# Patient Record
Sex: Female | Born: 1935 | Race: White | Hispanic: No | Marital: Married | Smoking: Never smoker
Health system: Southern US, Community
[De-identification: ages and names within clinical notes are randomized; demographics above are authoritative.]

## PROBLEM LIST (undated history)

## (undated) DIAGNOSIS — F0390 Unspecified dementia without behavioral disturbance: Secondary | ICD-10-CM

## (undated) DIAGNOSIS — E079 Disorder of thyroid, unspecified: Secondary | ICD-10-CM

## (undated) DIAGNOSIS — I10 Essential (primary) hypertension: Secondary | ICD-10-CM

## (undated) DIAGNOSIS — I4891 Unspecified atrial fibrillation: Secondary | ICD-10-CM

## (undated) HISTORY — DX: Disorder of thyroid, unspecified: E07.9

## (undated) HISTORY — DX: Unspecified atrial fibrillation: I48.91

---
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
## 2000-11-21 DIAGNOSIS — I4891 Unspecified atrial fibrillation: Secondary | ICD-10-CM

## 2000-11-21 HISTORY — DX: Unspecified atrial fibrillation: I48.91

## 2005-11-21 HISTORY — PX: INCONTINENCE SURGERY: SHX676

## 2011-11-22 HISTORY — PX: KYPHOPLASTY: SHX5884

## 2011-12-06 DIAGNOSIS — H4011X0 Primary open-angle glaucoma, stage unspecified: Secondary | ICD-10-CM | POA: Diagnosis not present

## 2011-12-06 DIAGNOSIS — H409 Unspecified glaucoma: Secondary | ICD-10-CM | POA: Diagnosis not present

## 2011-12-08 DIAGNOSIS — Z2889 Immunization not carried out for other reason: Secondary | ICD-10-CM | POA: Diagnosis not present

## 2011-12-08 DIAGNOSIS — R5381 Other malaise: Secondary | ICD-10-CM | POA: Diagnosis not present

## 2011-12-08 DIAGNOSIS — R5383 Other fatigue: Secondary | ICD-10-CM | POA: Diagnosis not present

## 2011-12-08 DIAGNOSIS — Z713 Dietary counseling and surveillance: Secondary | ICD-10-CM | POA: Diagnosis not present

## 2011-12-08 DIAGNOSIS — Z789 Other specified health status: Secondary | ICD-10-CM | POA: Diagnosis not present

## 2011-12-20 DIAGNOSIS — Z1231 Encounter for screening mammogram for malignant neoplasm of breast: Secondary | ICD-10-CM | POA: Diagnosis not present

## 2012-03-15 DIAGNOSIS — I4891 Unspecified atrial fibrillation: Secondary | ICD-10-CM | POA: Diagnosis not present

## 2012-03-15 DIAGNOSIS — I2510 Atherosclerotic heart disease of native coronary artery without angina pectoris: Secondary | ICD-10-CM | POA: Diagnosis not present

## 2012-04-04 DIAGNOSIS — H4011X0 Primary open-angle glaucoma, stage unspecified: Secondary | ICD-10-CM | POA: Diagnosis not present

## 2012-04-04 DIAGNOSIS — H409 Unspecified glaucoma: Secondary | ICD-10-CM | POA: Diagnosis not present

## 2012-04-04 DIAGNOSIS — H353190 Nonexudative age-related macular degeneration, unspecified eye, stage unspecified: Secondary | ICD-10-CM | POA: Diagnosis not present

## 2012-04-09 DIAGNOSIS — M48062 Spinal stenosis, lumbar region with neurogenic claudication: Secondary | ICD-10-CM | POA: Diagnosis not present

## 2012-04-09 DIAGNOSIS — M545 Low back pain: Secondary | ICD-10-CM | POA: Diagnosis not present

## 2012-04-09 DIAGNOSIS — M25569 Pain in unspecified knee: Secondary | ICD-10-CM | POA: Diagnosis not present

## 2012-04-09 DIAGNOSIS — M25559 Pain in unspecified hip: Secondary | ICD-10-CM | POA: Diagnosis not present

## 2012-04-12 DIAGNOSIS — M25559 Pain in unspecified hip: Secondary | ICD-10-CM | POA: Diagnosis not present

## 2012-04-12 DIAGNOSIS — M545 Low back pain: Secondary | ICD-10-CM | POA: Diagnosis not present

## 2012-04-12 DIAGNOSIS — M48061 Spinal stenosis, lumbar region without neurogenic claudication: Secondary | ICD-10-CM | POA: Diagnosis not present

## 2012-05-21 DIAGNOSIS — M48062 Spinal stenosis, lumbar region with neurogenic claudication: Secondary | ICD-10-CM | POA: Diagnosis not present

## 2012-05-21 DIAGNOSIS — M545 Low back pain: Secondary | ICD-10-CM | POA: Diagnosis not present

## 2012-05-21 DIAGNOSIS — M25559 Pain in unspecified hip: Secondary | ICD-10-CM | POA: Diagnosis not present

## 2012-07-02 DIAGNOSIS — M545 Low back pain: Secondary | ICD-10-CM | POA: Diagnosis not present

## 2012-07-02 DIAGNOSIS — M25559 Pain in unspecified hip: Secondary | ICD-10-CM | POA: Diagnosis not present

## 2012-07-02 DIAGNOSIS — M48062 Spinal stenosis, lumbar region with neurogenic claudication: Secondary | ICD-10-CM | POA: Diagnosis not present

## 2012-07-06 DIAGNOSIS — R5383 Other fatigue: Secondary | ICD-10-CM | POA: Diagnosis not present

## 2012-07-06 DIAGNOSIS — Z789 Other specified health status: Secondary | ICD-10-CM | POA: Diagnosis not present

## 2012-07-06 DIAGNOSIS — Z713 Dietary counseling and surveillance: Secondary | ICD-10-CM | POA: Diagnosis not present

## 2012-07-06 DIAGNOSIS — R5381 Other malaise: Secondary | ICD-10-CM | POA: Diagnosis not present

## 2012-07-06 DIAGNOSIS — E039 Hypothyroidism, unspecified: Secondary | ICD-10-CM | POA: Diagnosis not present

## 2012-07-06 DIAGNOSIS — E785 Hyperlipidemia, unspecified: Secondary | ICD-10-CM | POA: Diagnosis not present

## 2012-07-06 DIAGNOSIS — I10 Essential (primary) hypertension: Secondary | ICD-10-CM | POA: Diagnosis not present

## 2012-08-28 DIAGNOSIS — M519 Unspecified thoracic, thoracolumbar and lumbosacral intervertebral disc disorder: Secondary | ICD-10-CM | POA: Diagnosis not present

## 2012-08-28 DIAGNOSIS — M48062 Spinal stenosis, lumbar region with neurogenic claudication: Secondary | ICD-10-CM | POA: Diagnosis not present

## 2012-08-28 DIAGNOSIS — E7800 Pure hypercholesterolemia, unspecified: Secondary | ICD-10-CM | POA: Diagnosis not present

## 2012-08-28 DIAGNOSIS — I10 Essential (primary) hypertension: Secondary | ICD-10-CM | POA: Diagnosis not present

## 2012-08-28 DIAGNOSIS — I4891 Unspecified atrial fibrillation: Secondary | ICD-10-CM | POA: Diagnosis not present

## 2012-08-28 DIAGNOSIS — Z882 Allergy status to sulfonamides status: Secondary | ICD-10-CM | POA: Diagnosis not present

## 2012-08-30 DIAGNOSIS — I10 Essential (primary) hypertension: Secondary | ICD-10-CM | POA: Diagnosis not present

## 2012-08-30 DIAGNOSIS — E7800 Pure hypercholesterolemia, unspecified: Secondary | ICD-10-CM | POA: Diagnosis not present

## 2012-08-30 DIAGNOSIS — M48061 Spinal stenosis, lumbar region without neurogenic claudication: Secondary | ICD-10-CM | POA: Diagnosis not present

## 2012-08-30 DIAGNOSIS — M5380 Other specified dorsopathies, site unspecified: Secondary | ICD-10-CM | POA: Diagnosis not present

## 2012-08-30 DIAGNOSIS — M519 Unspecified thoracic, thoracolumbar and lumbosacral intervertebral disc disorder: Secondary | ICD-10-CM | POA: Diagnosis not present

## 2012-08-30 DIAGNOSIS — M5137 Other intervertebral disc degeneration, lumbosacral region: Secondary | ICD-10-CM | POA: Diagnosis not present

## 2012-08-30 DIAGNOSIS — I4891 Unspecified atrial fibrillation: Secondary | ICD-10-CM | POA: Diagnosis not present

## 2012-08-30 DIAGNOSIS — M48062 Spinal stenosis, lumbar region with neurogenic claudication: Secondary | ICD-10-CM | POA: Diagnosis not present

## 2012-08-30 DIAGNOSIS — Z882 Allergy status to sulfonamides status: Secondary | ICD-10-CM | POA: Diagnosis not present

## 2012-09-11 DIAGNOSIS — M48061 Spinal stenosis, lumbar region without neurogenic claudication: Secondary | ICD-10-CM | POA: Diagnosis not present

## 2012-09-11 DIAGNOSIS — Z09 Encounter for follow-up examination after completed treatment for conditions other than malignant neoplasm: Secondary | ICD-10-CM | POA: Diagnosis not present

## 2012-09-11 DIAGNOSIS — Z713 Dietary counseling and surveillance: Secondary | ICD-10-CM | POA: Diagnosis not present

## 2012-09-11 DIAGNOSIS — Z789 Other specified health status: Secondary | ICD-10-CM | POA: Diagnosis not present

## 2012-09-11 DIAGNOSIS — I499 Cardiac arrhythmia, unspecified: Secondary | ICD-10-CM | POA: Diagnosis not present

## 2012-09-14 DIAGNOSIS — M545 Low back pain: Secondary | ICD-10-CM | POA: Diagnosis not present

## 2012-09-14 DIAGNOSIS — M48062 Spinal stenosis, lumbar region with neurogenic claudication: Secondary | ICD-10-CM | POA: Diagnosis not present

## 2012-09-14 DIAGNOSIS — M5137 Other intervertebral disc degeneration, lumbosacral region: Secondary | ICD-10-CM | POA: Diagnosis not present

## 2012-09-14 DIAGNOSIS — M25559 Pain in unspecified hip: Secondary | ICD-10-CM | POA: Diagnosis not present

## 2012-09-19 DIAGNOSIS — I10 Essential (primary) hypertension: Secondary | ICD-10-CM | POA: Diagnosis not present

## 2012-09-19 DIAGNOSIS — E785 Hyperlipidemia, unspecified: Secondary | ICD-10-CM | POA: Diagnosis not present

## 2012-09-19 DIAGNOSIS — I4891 Unspecified atrial fibrillation: Secondary | ICD-10-CM | POA: Diagnosis not present

## 2012-09-19 DIAGNOSIS — K5730 Diverticulosis of large intestine without perforation or abscess without bleeding: Secondary | ICD-10-CM | POA: Diagnosis not present

## 2012-10-01 DIAGNOSIS — R079 Chest pain, unspecified: Secondary | ICD-10-CM | POA: Diagnosis not present

## 2012-10-01 DIAGNOSIS — I4891 Unspecified atrial fibrillation: Secondary | ICD-10-CM | POA: Diagnosis not present

## 2012-10-04 DIAGNOSIS — I4891 Unspecified atrial fibrillation: Secondary | ICD-10-CM | POA: Diagnosis not present

## 2012-10-04 DIAGNOSIS — E785 Hyperlipidemia, unspecified: Secondary | ICD-10-CM | POA: Diagnosis not present

## 2012-11-23 DIAGNOSIS — Z23 Encounter for immunization: Secondary | ICD-10-CM | POA: Diagnosis not present

## 2012-12-13 DIAGNOSIS — H353190 Nonexudative age-related macular degeneration, unspecified eye, stage unspecified: Secondary | ICD-10-CM | POA: Diagnosis not present

## 2013-01-15 DIAGNOSIS — E039 Hypothyroidism, unspecified: Secondary | ICD-10-CM | POA: Diagnosis not present

## 2013-01-15 DIAGNOSIS — I499 Cardiac arrhythmia, unspecified: Secondary | ICD-10-CM | POA: Diagnosis not present

## 2013-01-15 DIAGNOSIS — Z713 Dietary counseling and surveillance: Secondary | ICD-10-CM | POA: Diagnosis not present

## 2013-01-15 DIAGNOSIS — Z87891 Personal history of nicotine dependence: Secondary | ICD-10-CM | POA: Diagnosis not present

## 2013-01-15 DIAGNOSIS — E785 Hyperlipidemia, unspecified: Secondary | ICD-10-CM | POA: Diagnosis not present

## 2013-01-15 DIAGNOSIS — I10 Essential (primary) hypertension: Secondary | ICD-10-CM | POA: Diagnosis not present

## 2013-01-15 DIAGNOSIS — Z789 Other specified health status: Secondary | ICD-10-CM | POA: Diagnosis not present

## 2013-01-16 DIAGNOSIS — E039 Hypothyroidism, unspecified: Secondary | ICD-10-CM | POA: Diagnosis not present

## 2013-01-16 DIAGNOSIS — E785 Hyperlipidemia, unspecified: Secondary | ICD-10-CM | POA: Diagnosis not present

## 2013-01-16 DIAGNOSIS — I10 Essential (primary) hypertension: Secondary | ICD-10-CM | POA: Diagnosis not present

## 2013-01-18 DIAGNOSIS — H40019 Open angle with borderline findings, low risk, unspecified eye: Secondary | ICD-10-CM | POA: Diagnosis not present

## 2013-01-18 DIAGNOSIS — H26499 Other secondary cataract, unspecified eye: Secondary | ICD-10-CM | POA: Diagnosis not present

## 2013-02-22 DIAGNOSIS — H4011X0 Primary open-angle glaucoma, stage unspecified: Secondary | ICD-10-CM | POA: Diagnosis not present

## 2013-04-08 DIAGNOSIS — M316 Other giant cell arteritis: Secondary | ICD-10-CM | POA: Diagnosis not present

## 2013-04-08 DIAGNOSIS — I776 Arteritis, unspecified: Secondary | ICD-10-CM | POA: Diagnosis not present

## 2013-04-08 DIAGNOSIS — H53129 Transient visual loss, unspecified eye: Secondary | ICD-10-CM | POA: Diagnosis not present

## 2013-04-08 DIAGNOSIS — H3400 Transient retinal artery occlusion, unspecified eye: Secondary | ICD-10-CM | POA: Diagnosis not present

## 2013-04-09 DIAGNOSIS — I6529 Occlusion and stenosis of unspecified carotid artery: Secondary | ICD-10-CM | POA: Diagnosis not present

## 2013-04-09 DIAGNOSIS — H3400 Transient retinal artery occlusion, unspecified eye: Secondary | ICD-10-CM | POA: Diagnosis not present

## 2013-04-22 DIAGNOSIS — E039 Hypothyroidism, unspecified: Secondary | ICD-10-CM | POA: Diagnosis not present

## 2013-04-22 DIAGNOSIS — Z789 Other specified health status: Secondary | ICD-10-CM | POA: Diagnosis not present

## 2013-04-22 DIAGNOSIS — Z87891 Personal history of nicotine dependence: Secondary | ICD-10-CM | POA: Diagnosis not present

## 2013-04-22 DIAGNOSIS — Z713 Dietary counseling and surveillance: Secondary | ICD-10-CM | POA: Diagnosis not present

## 2013-04-22 DIAGNOSIS — I10 Essential (primary) hypertension: Secondary | ICD-10-CM | POA: Diagnosis not present

## 2013-04-24 DIAGNOSIS — Z1231 Encounter for screening mammogram for malignant neoplasm of breast: Secondary | ICD-10-CM | POA: Diagnosis not present

## 2013-04-26 DIAGNOSIS — H4011X0 Primary open-angle glaucoma, stage unspecified: Secondary | ICD-10-CM | POA: Diagnosis not present

## 2013-06-04 DIAGNOSIS — H40019 Open angle with borderline findings, low risk, unspecified eye: Secondary | ICD-10-CM | POA: Diagnosis not present

## 2013-06-28 DIAGNOSIS — H401290 Low-tension glaucoma, unspecified eye, stage unspecified: Secondary | ICD-10-CM | POA: Diagnosis not present

## 2013-07-05 DIAGNOSIS — H401290 Low-tension glaucoma, unspecified eye, stage unspecified: Secondary | ICD-10-CM | POA: Diagnosis not present

## 2013-07-12 DIAGNOSIS — H1010 Acute atopic conjunctivitis, unspecified eye: Secondary | ICD-10-CM | POA: Diagnosis not present

## 2013-09-06 DIAGNOSIS — Z23 Encounter for immunization: Secondary | ICD-10-CM | POA: Diagnosis not present

## 2013-10-22 DIAGNOSIS — E039 Hypothyroidism, unspecified: Secondary | ICD-10-CM | POA: Diagnosis not present

## 2013-10-22 DIAGNOSIS — I10 Essential (primary) hypertension: Secondary | ICD-10-CM | POA: Diagnosis not present

## 2013-10-22 DIAGNOSIS — F329 Major depressive disorder, single episode, unspecified: Secondary | ICD-10-CM | POA: Diagnosis not present

## 2013-10-22 DIAGNOSIS — E785 Hyperlipidemia, unspecified: Secondary | ICD-10-CM | POA: Diagnosis not present

## 2013-10-23 DIAGNOSIS — E039 Hypothyroidism, unspecified: Secondary | ICD-10-CM | POA: Diagnosis not present

## 2013-10-23 DIAGNOSIS — E785 Hyperlipidemia, unspecified: Secondary | ICD-10-CM | POA: Diagnosis not present

## 2013-12-25 DIAGNOSIS — H4011X0 Primary open-angle glaucoma, stage unspecified: Secondary | ICD-10-CM | POA: Diagnosis not present

## 2013-12-25 DIAGNOSIS — H409 Unspecified glaucoma: Secondary | ICD-10-CM | POA: Diagnosis not present

## 2014-01-04 DIAGNOSIS — H04129 Dry eye syndrome of unspecified lacrimal gland: Secondary | ICD-10-CM | POA: Diagnosis not present

## 2014-03-25 DIAGNOSIS — H353190 Nonexudative age-related macular degeneration, unspecified eye, stage unspecified: Secondary | ICD-10-CM | POA: Diagnosis not present

## 2014-05-14 ENCOUNTER — Ambulatory Visit (INDEPENDENT_AMBULATORY_CARE_PROVIDER_SITE_OTHER): Payer: Medicare Other | Admitting: Family Medicine

## 2014-05-14 ENCOUNTER — Encounter: Payer: Self-pay | Admitting: Family Medicine

## 2014-05-14 VITALS — BP 141/90 | HR 84 | Temp 98.2°F | Wt 156.0 lb

## 2014-05-14 DIAGNOSIS — N3281 Overactive bladder: Secondary | ICD-10-CM | POA: Insufficient documentation

## 2014-05-14 DIAGNOSIS — Z79899 Other long term (current) drug therapy: Secondary | ICD-10-CM

## 2014-05-14 DIAGNOSIS — E039 Hypothyroidism, unspecified: Secondary | ICD-10-CM | POA: Diagnosis not present

## 2014-05-14 DIAGNOSIS — N318 Other neuromuscular dysfunction of bladder: Secondary | ICD-10-CM | POA: Diagnosis not present

## 2014-05-14 DIAGNOSIS — I4891 Unspecified atrial fibrillation: Secondary | ICD-10-CM | POA: Diagnosis not present

## 2014-05-14 DIAGNOSIS — I10 Essential (primary) hypertension: Secondary | ICD-10-CM | POA: Diagnosis not present

## 2014-05-14 DIAGNOSIS — E785 Hyperlipidemia, unspecified: Secondary | ICD-10-CM | POA: Diagnosis not present

## 2014-05-14 DIAGNOSIS — I482 Chronic atrial fibrillation, unspecified: Secondary | ICD-10-CM | POA: Insufficient documentation

## 2014-05-14 DIAGNOSIS — Z5181 Encounter for therapeutic drug level monitoring: Secondary | ICD-10-CM | POA: Insufficient documentation

## 2014-05-14 DIAGNOSIS — M25559 Pain in unspecified hip: Secondary | ICD-10-CM

## 2014-05-14 DIAGNOSIS — M25551 Pain in right hip: Secondary | ICD-10-CM | POA: Insufficient documentation

## 2014-05-14 MED ORDER — MIRABEGRON ER 25 MG PO TB24: 25.0000 mg | ORAL_TABLET | Freq: Every day | ORAL | Status: DC

## 2014-05-14 NOTE — Patient Instructions (Signed)
Call me if bladder symptoms improve over the next four weeks after starting the daily Myrbetriq

## 2014-05-14 NOTE — Progress Notes (Signed)
CC: Wanda Myers is a 80 y.o. female is here for Establish Care   Subjective: HPI:  Very pleasant 80 year old here to establish care accompanied by her daughter Wanda Myers  Reports a history of hypothyroidism that was acquired due to radiation exposure. She is currently taking levothyroxine 50 mcg on a daily basis without missed doses. She believes it has been well over a year since TSH was checked last. Denies unintentional weight loss, gain. Denies constipation or diarrhea  She reports a history of chronic atrial fibrillation currently on a beta blocker, metoprolol, and has elected to avoid anticoagulation due to fall risk. She denies peripheral edema, orthopnea nor rapid heartbeat.  Reports a history of hyperlipidemia: Taking pravastatin on a daily basis without myalgias nor right upper quadrant pain. She is uncertain when her last fasting lipid panel was obtained. She's not fasting today.  Complains of bladder leakage that has been occurring on a daily basis for matter of months to years. Originally was improved with a bladder sling but now her symptoms are back and are moderate to severe in severity. Can occur anytime of the day she is wearing past 24 hours of the day. Present only in the sense of urgency, denies any leakage with cough/sneeze/Valsalva maneuver.  Denies urinary frequency, nor dysuria. She does not believe she's ever been on medications for this.  Reports a history of hypertension: Currently on metoprolol. No blood pressures to report.    Complains of right hip pain that has been present for matter of years. Described as only pain, moderate in severity, almost absent with weightbearing or while walking and for the most part only noticed when lying on the right side. It will wake her from sleep if she rolls over on her right hip. She denies any recent or remote trauma or overexertion. Pain is nonradiating. She denies any groin pain. She believes that a physician once placed 6  "objects"in and around the hip but this did not improve her pain whatsoever. No other interventions as of yet.  Review of Systems - General ROS: negative for - chills, fever, night sweats, weight gain or weight loss Ophthalmic ROS: negative for - decreased vision Psychological ROS: negative for - anxiety or depression ENT ROS: negative for - hearing change, nasal congestion, tinnitus or allergies Hematological and Lymphatic ROS: negative for - bleeding problems, bruising or swollen lymph nodes Breast ROS: negative Respiratory ROS: no cough, shortness of breath, or wheezing Cardiovascular ROS: no chest pain or dyspnea on exertion Gastrointestinal ROS: no abdominal pain, change in bowel habits, or black or bloody stools Genito-Urinary ROS: negative for - genital discharge, genital ulcers,  or abnormal bleeding from genitals Musculoskeletal ROS: negative for - joint pain or muscle pain other than that described above Neurological ROS: negative for - headaches or memory loss Dermatological ROS: negative for lumps, mole changes, rash and skin lesion changes  Past Medical History  Diagnosis Date  . A-fib 2002  . Thyroid disease     Past Surgical History  Procedure Laterality Date  . Incontinence surgery  2007  . Kyphoplasty  2013  . Appendectomy  1944   Family History  Problem Relation Age of Onset  . Heart attack Father   . Hyperlipidemia Father   . Hypertension Father     History   Social History  . Marital Status: Married    Spouse Name: N/A    Number of Children: N/A  . Years of Education: N/A   Occupational History  . Not  on file.   Social History Main Topics  . Smoking status: Never Smoker   . Smokeless tobacco: Not on file  . Alcohol Use: No  . Drug Use: Not on file  . Sexual Activity: Not on file   Other Topics Concern  . Not on file   Social History Narrative  . No narrative on file     Objective: BP 156/100  Pulse 84  Temp(Src) 98.2 F (36.8 C) (Oral)   Wt 156 lb (70.761 kg)  General: Alert and Oriented, No Acute Distress HEENT: Pupils equal, round, reactive to light. Conjunctivae clear.  Moist mucous membranes pharynx unremarkable Lungs: Clear to auscultation bilaterally, no wheezing/ronchi/rales.  Comfortable work of breathing. Good air movement. Cardiac: Irregular rate and rhythm. Normal S1/S2.  No murmurs, rubs, nor gallops.   Abdomen: Soft nontender Extremities: No peripheral edema.  Strong peripheral pulses. She has full range of motion and strength in the right lower extremity. Exam of the right lower extreme shows negative log roll, negative femoral grind, negative straight leg raise, negative FABER, pain is somewhat reproduced with palpation of the right greater trochanter and just slightly superior to this.  Mental Status: No depression, anxiety, nor agitation. Skin: Warm and dry.  Assessment & Plan: Wanda CalicoFrances was seen today for establish care.  Diagnoses and associated orders for this visit:  Hypothyroidism (acquired) - TSH  Chronic atrial fibrillation  Hyperlipidemia  Overactive bladder  Essential hypertension, benign - COMPLETE METABOLIC PANEL WITH GFR  Encounter for monitoring statin therapy - COMPLETE METABOLIC PANEL WITH GFR  Right hip pain  Other Orders - mirabegron ER (MYRBETRIQ) 25 MG TB24 tablet; Take 1 tablet (25 mg total) by mouth daily.    Hypothyroidism: Overdue for TSH continue levothyroxine pending results Chronic atrial fibrillation: Rate controlled, agree with the decision of no anticoagulation beyond aspirin given her fall risk for Hyperlipidemia: Continue pravastatin Will obtain lipid panel at her convenience when she can fast, checking liver enzymes today Essential hypertension: Uncontrolled, I'd like to see some home blood pressure readings before adjusting metoprolol Overactive bladder: Start Myrbetriq, 28 days of samples were provided, I've asked family to call me if they're providing any  benefit for a formal prescription Right hip pain: I've encouraged him to followup with Dr. Karie Schwalbe. in sports medicine for further evaluation of my suspicion of right greater trochanteric bursitis. I do not think that films are needed at this time.Referral will be placed.  Return in about 3 months (around 08/14/2014) for Routine Follow Up.

## 2014-05-15 ENCOUNTER — Telehealth: Payer: Self-pay | Admitting: Family Medicine

## 2014-05-15 ENCOUNTER — Encounter: Payer: Self-pay | Admitting: Family Medicine

## 2014-05-15 DIAGNOSIS — N183 Chronic kidney disease, stage 3 unspecified: Secondary | ICD-10-CM | POA: Insufficient documentation

## 2014-05-15 LAB — COMPLETE METABOLIC PANEL WITH GFR
ALBUMIN: 4.3 g/dL (ref 3.5–5.2)
ALT: 17 U/L (ref 0–35)
AST: 27 U/L (ref 0–37)
Alkaline Phosphatase: 50 U/L (ref 39–117)
BUN: 20 mg/dL (ref 6–23)
CALCIUM: 10.2 mg/dL (ref 8.4–10.5)
CHLORIDE: 103 meq/L (ref 96–112)
CO2: 33 mEq/L — ABNORMAL HIGH (ref 19–32)
Creat: 1.12 mg/dL — ABNORMAL HIGH (ref 0.50–1.10)
GFR, Est African American: 50 mL/min — ABNORMAL LOW
GFR, Est Non African American: 44 mL/min — ABNORMAL LOW
Glucose, Bld: 105 mg/dL — ABNORMAL HIGH (ref 70–99)
POTASSIUM: 3.9 meq/L (ref 3.5–5.3)
Sodium: 141 mEq/L (ref 135–145)
Total Bilirubin: 0.8 mg/dL (ref 0.2–1.2)
Total Protein: 6.9 g/dL (ref 6.0–8.3)

## 2014-05-15 LAB — TSH: TSH: 1.363 u[IU]/mL (ref 0.350–4.500)

## 2014-05-15 NOTE — Telephone Encounter (Signed)
Wanda Myers, Will you please let the patient's family know that a [facility] representative should be contacting them in the next few days regarding a home visit.  For both Wanda Myers and her husband.

## 2014-05-16 NOTE — Telephone Encounter (Signed)
Pt.notified

## 2014-05-19 ENCOUNTER — Ambulatory Visit (INDEPENDENT_AMBULATORY_CARE_PROVIDER_SITE_OTHER): Payer: Medicare Other | Admitting: Sports Medicine

## 2014-05-19 ENCOUNTER — Encounter: Payer: Self-pay | Admitting: Sports Medicine

## 2014-05-19 VITALS — BP 160/113 | HR 85 | Ht 67.0 in

## 2014-05-19 DIAGNOSIS — M76899 Other specified enthesopathies of unspecified lower limb, excluding foot: Secondary | ICD-10-CM | POA: Diagnosis not present

## 2014-05-19 DIAGNOSIS — M7061 Trochanteric bursitis, right hip: Secondary | ICD-10-CM | POA: Insufficient documentation

## 2014-05-19 MED ORDER — METOPROLOL TARTRATE 25 MG PO TABS: 25.0000 mg | ORAL_TABLET | Freq: Two times a day (BID) | ORAL | Status: DC

## 2014-05-19 NOTE — Assessment & Plan Note (Signed)
Trochanteric bursa injection as above. Home health physical therapy. Return in one month.

## 2014-05-19 NOTE — Progress Notes (Signed)
   Subjective:    I'm seeing this patient as a consultation for:  Dr. Ivan AnchorsHommel  CC: Right hip pain  HPI: This is a very pleasant 80 year old female, for several years she's had pain which he localizes over the right greater trochanter, she has difficulty laying on the ipsilateral side. Pain is moderate, persistent without radiation. She has tried over-the-counter NSAIDs without any improvement, she has never had a trochanteric bursa injection, and has never had formal physical therapy. She is essentially wheelchair-bound and has difficulty leaving her home.  Past medical history, Surgical history, Family history not pertinant except as noted below, Social history, Allergies, and medications have been entered into the medical record, reviewed, and no changes needed.   Review of Systems: No headache, visual changes, nausea, vomiting, diarrhea, constipation, dizziness, abdominal pain, skin rash, fevers, chills, night sweats, weight loss, swollen lymph nodes, body aches, joint swelling, muscle aches, chest pain, shortness of breath, mood changes, visual or auditory hallucinations.   Objective:   General: Well Developed, well nourished, and in no acute distress.  Neuro/Psych: Alert and oriented x3, extra-ocular muscles intact, able to move all 4 extremities, sensation grossly intact. Skin: Warm and dry, no rashes noted.  Respiratory: Not using accessory muscles, speaking in full sentences, trachea midline.  Cardiovascular: Pulses palpable, no extremity edema. Abdomen: Does not appear distended. Right hip: Tender to palpation over greater trochanter, weak abductor's, no pain with internal rotation, excellent rotation.  Procedure:  Injection of right greater trochanter bursa Consent obtained and verified. Time-out conducted. Noted no overlying erythema, induration, or other signs of local infection. Skin prepped in a sterile fashion. Topical analgesic spray: Ethyl chloride. Completed without  difficulty. Meds: Using a spinal needle, 1 cc Kenalog 40, 4 cc lidocaine injected in a fanlike pattern into and around the greater trochanteric bursa. Pain immediately improved suggesting accurate placement of the medication. Advised to call if fevers/chills, erythema, induration, drainage, or persistent bleeding.  Impression and Recommendations:   This case required medical decision making of moderate complexity.

## 2014-05-26 ENCOUNTER — Encounter: Payer: Self-pay | Admitting: Family Medicine

## 2014-05-28 ENCOUNTER — Encounter: Payer: Self-pay | Admitting: Sports Medicine

## 2014-05-28 ENCOUNTER — Other Ambulatory Visit: Payer: Self-pay | Admitting: Family Medicine

## 2014-05-28 DIAGNOSIS — M7062 Trochanteric bursitis, left hip: Principal | ICD-10-CM

## 2014-05-28 DIAGNOSIS — M7061 Trochanteric bursitis, right hip: Secondary | ICD-10-CM

## 2014-05-30 MED ORDER — MIRABEGRON ER 25 MG PO TB24: 25.0000 mg | ORAL_TABLET | Freq: Every day | ORAL | Status: DC

## 2014-05-30 MED ORDER — PRAVASTATIN SODIUM 40 MG PO TABS: 40.0000 mg | ORAL_TABLET | Freq: Every day | ORAL | Status: DC

## 2014-05-30 MED ORDER — LEVOTHYROXINE SODIUM 50 MCG PO TABS: 50.0000 ug | ORAL_TABLET | Freq: Every day | ORAL | Status: DC

## 2014-06-03 DIAGNOSIS — Z993 Dependence on wheelchair: Secondary | ICD-10-CM | POA: Diagnosis not present

## 2014-06-03 DIAGNOSIS — IMO0001 Reserved for inherently not codable concepts without codable children: Secondary | ICD-10-CM | POA: Diagnosis not present

## 2014-06-03 DIAGNOSIS — I10 Essential (primary) hypertension: Secondary | ICD-10-CM | POA: Diagnosis not present

## 2014-06-03 DIAGNOSIS — M76899 Other specified enthesopathies of unspecified lower limb, excluding foot: Secondary | ICD-10-CM | POA: Diagnosis not present

## 2014-06-06 DIAGNOSIS — M76899 Other specified enthesopathies of unspecified lower limb, excluding foot: Secondary | ICD-10-CM | POA: Diagnosis not present

## 2014-06-06 DIAGNOSIS — IMO0001 Reserved for inherently not codable concepts without codable children: Secondary | ICD-10-CM | POA: Diagnosis not present

## 2014-06-06 DIAGNOSIS — I10 Essential (primary) hypertension: Secondary | ICD-10-CM | POA: Diagnosis not present

## 2014-06-06 DIAGNOSIS — Z993 Dependence on wheelchair: Secondary | ICD-10-CM | POA: Diagnosis not present

## 2014-06-09 DIAGNOSIS — I10 Essential (primary) hypertension: Secondary | ICD-10-CM | POA: Diagnosis not present

## 2014-06-09 DIAGNOSIS — IMO0001 Reserved for inherently not codable concepts without codable children: Secondary | ICD-10-CM | POA: Diagnosis not present

## 2014-06-09 DIAGNOSIS — Z993 Dependence on wheelchair: Secondary | ICD-10-CM | POA: Diagnosis not present

## 2014-06-09 DIAGNOSIS — M76899 Other specified enthesopathies of unspecified lower limb, excluding foot: Secondary | ICD-10-CM | POA: Diagnosis not present

## 2014-06-11 DIAGNOSIS — I10 Essential (primary) hypertension: Secondary | ICD-10-CM | POA: Diagnosis not present

## 2014-06-11 DIAGNOSIS — Z993 Dependence on wheelchair: Secondary | ICD-10-CM | POA: Diagnosis not present

## 2014-06-11 DIAGNOSIS — IMO0001 Reserved for inherently not codable concepts without codable children: Secondary | ICD-10-CM | POA: Diagnosis not present

## 2014-06-11 DIAGNOSIS — M76899 Other specified enthesopathies of unspecified lower limb, excluding foot: Secondary | ICD-10-CM | POA: Diagnosis not present

## 2014-06-16 DIAGNOSIS — I10 Essential (primary) hypertension: Secondary | ICD-10-CM | POA: Diagnosis not present

## 2014-06-16 DIAGNOSIS — IMO0001 Reserved for inherently not codable concepts without codable children: Secondary | ICD-10-CM | POA: Diagnosis not present

## 2014-06-16 DIAGNOSIS — Z993 Dependence on wheelchair: Secondary | ICD-10-CM | POA: Diagnosis not present

## 2014-06-16 DIAGNOSIS — M76899 Other specified enthesopathies of unspecified lower limb, excluding foot: Secondary | ICD-10-CM | POA: Diagnosis not present

## 2014-06-20 DIAGNOSIS — M76899 Other specified enthesopathies of unspecified lower limb, excluding foot: Secondary | ICD-10-CM | POA: Diagnosis not present

## 2014-06-20 DIAGNOSIS — IMO0001 Reserved for inherently not codable concepts without codable children: Secondary | ICD-10-CM | POA: Diagnosis not present

## 2014-06-20 DIAGNOSIS — Z993 Dependence on wheelchair: Secondary | ICD-10-CM | POA: Diagnosis not present

## 2014-06-20 DIAGNOSIS — I10 Essential (primary) hypertension: Secondary | ICD-10-CM | POA: Diagnosis not present

## 2014-06-23 ENCOUNTER — Encounter: Payer: Self-pay | Admitting: Family Medicine

## 2014-06-23 ENCOUNTER — Ambulatory Visit (INDEPENDENT_AMBULATORY_CARE_PROVIDER_SITE_OTHER): Payer: Medicare Other | Admitting: Family Medicine

## 2014-06-23 VITALS — BP 155/97 | HR 87 | Ht 65.0 in

## 2014-06-23 DIAGNOSIS — R5381 Other malaise: Secondary | ICD-10-CM | POA: Diagnosis not present

## 2014-06-23 DIAGNOSIS — R5383 Other fatigue: Secondary | ICD-10-CM

## 2014-06-23 DIAGNOSIS — R300 Dysuria: Secondary | ICD-10-CM | POA: Diagnosis not present

## 2014-06-23 DIAGNOSIS — I482 Chronic atrial fibrillation, unspecified: Secondary | ICD-10-CM

## 2014-06-23 DIAGNOSIS — I4891 Unspecified atrial fibrillation: Secondary | ICD-10-CM

## 2014-06-23 LAB — POCT URINALYSIS DIPSTICK
BILIRUBIN UA: NEGATIVE
GLUCOSE UA: NEGATIVE
KETONES UA: NEGATIVE
Nitrite, UA: NEGATIVE
Protein, UA: 30
SPEC GRAV UA: 1.02
Urobilinogen, UA: 0.2
pH, UA: 7.5

## 2014-06-23 NOTE — Progress Notes (Signed)
CC: Wanda Myers is a 80 y.o. female is here for Extremity Weakness   Subjective: HPI:  Accompanied by daughter, Wanda Myers   Patient reports decreased energy over the past week but has not be getting better or worse it is present all hours of the day on a daily basis. Is described as moderate to severe in severity to the point where she often doesn't want to get out of of her night gown. She has been spending the majority of the time over the past week in her bed. She reports some focal weakness in both lower extremities localized to the knees but denies weakness elsewhere, states she just feels "worn out"for no particular reason. Accompanied by decreased appetite but still taking fluids. Denies cough, shortness of breath, fevers, chills, confusion, mental disturbance, changing medications, abdominal pain, diarrhea, blood in stool, flank pain nor nausea. Review of systems is positive for mild dysuria and continued bladder incontinence. Denies rashes. Denies fevers, chills or night sweats   Review Of Systems Outlined In HPI  Past Medical History  Diagnosis Date  . A-fib 2002  . Thyroid disease     Past Surgical History  Procedure Laterality Date  . Incontinence surgery  2007  . Kyphoplasty  2013  . Appendectomy  1944   Family History  Problem Relation Age of Onset  . Heart attack Father   . Hyperlipidemia Father   . Hypertension Father     History   Social History  . Marital Status: Married    Spouse Name: N/A    Number of Children: N/A  . Years of Education: N/A   Occupational History  . Not on file.   Social History Main Topics  . Smoking status: Never Smoker   . Smokeless tobacco: Not on file  . Alcohol Use: No  . Drug Use: Not on file  . Sexual Activity: Not on file   Other Topics Concern  . Not on file   Social History Narrative  . No narrative on file     Objective: BP 155/97  Pulse 87  Ht 5\' 5"  (1.651 m)  General: Alert and Oriented, No Acute  Distress HEENT: Pupils equal, round, reactive to light. Conjunctivae clear.  External ears unremarkable, canals clear with intact TMs with appropriate landmarks.  Middle ear appears open without effusion. Pink inferior turbinates.  Moist mucous membranes, pharynx without inflammation nor lesions.  Neck supple without palpable lymphadenopathy nor abnormal masses. Lungs: Clear to auscultation bilaterally, no wheezing/ronchi/rales.  Comfortable work of breathing. Good air movement. Cardiac:  irregularly irregular rhythm with mild tachycardia. Normal S1/S2.  No murmurs, rubs, nor gallops.   Abdomen: Normal bowel sounds, soft and non tender without palpable masses. Extremities: No peripheral edema.  Strong peripheral pulses. 4/5 strength in knee extension otherwise full range of motion and strength in both lower extremities  Mental Status: No depression, anxiety, nor agitation. Skin: Warm and dry.  Assessment & Plan: Wanda Myers was seen today for extremity weakness.  Diagnoses and associated orders for this visit:  Other fatigue - COMPLETE METABOLIC PANEL WITH GFR - CBC w/Diff  Chronic atrial fibrillation - Ambulatory referral to Cardiology  Dysuria - Urine culture     fatigue: Differential remains broad, urinalysis shows trace intact blood, if lab work above is unremarkable will begin antibiotic for presumed UTI and I will follow her urine culture. They were given a urine hat and sample bottle to collect more urine at home as she did not provide a urine today for  culture. Additionally anticipate increasing metoprolol pending above labs given mild tachycardia today.family is requesting nonemergent referral to cardiology    Return if symptoms worsen or fail to improve.

## 2014-06-24 ENCOUNTER — Telehealth: Payer: Self-pay | Admitting: Family Medicine

## 2014-06-24 DIAGNOSIS — N390 Urinary tract infection, site not specified: Secondary | ICD-10-CM

## 2014-06-24 DIAGNOSIS — R300 Dysuria: Secondary | ICD-10-CM | POA: Diagnosis not present

## 2014-06-24 LAB — COMPLETE METABOLIC PANEL WITH GFR
ALBUMIN: 3.3 g/dL — AB (ref 3.5–5.2)
ALT: 16 U/L (ref 0–35)
AST: 21 U/L (ref 0–37)
Alkaline Phosphatase: 51 U/L (ref 39–117)
BUN: 14 mg/dL (ref 6–23)
CO2: 27 meq/L (ref 19–32)
Calcium: 9.2 mg/dL (ref 8.4–10.5)
Chloride: 98 mEq/L (ref 96–112)
Creat: 0.94 mg/dL (ref 0.50–1.10)
GFR, EST AFRICAN AMERICAN: 62 mL/min
GFR, EST NON AFRICAN AMERICAN: 54 mL/min — AB
GLUCOSE: 132 mg/dL — AB (ref 70–99)
POTASSIUM: 4 meq/L (ref 3.5–5.3)
Sodium: 134 mEq/L — ABNORMAL LOW (ref 135–145)
Total Bilirubin: 0.7 mg/dL (ref 0.2–1.2)
Total Protein: 5.9 g/dL — ABNORMAL LOW (ref 6.0–8.3)

## 2014-06-24 LAB — CBC WITH DIFFERENTIAL/PLATELET
Basophils Absolute: 0 10*3/uL (ref 0.0–0.1)
Basophils Relative: 0 % (ref 0–1)
EOS ABS: 0.2 10*3/uL (ref 0.0–0.7)
Eosinophils Relative: 4 % (ref 0–5)
HEMATOCRIT: 40.1 % (ref 36.0–46.0)
HEMOGLOBIN: 13.5 g/dL (ref 12.0–15.0)
Lymphocytes Relative: 20 % (ref 12–46)
Lymphs Abs: 1.2 10*3/uL (ref 0.7–4.0)
MCH: 29.5 pg (ref 26.0–34.0)
MCHC: 33.7 g/dL (ref 30.0–36.0)
MCV: 87.6 fL (ref 78.0–100.0)
MONO ABS: 0.3 10*3/uL (ref 0.1–1.0)
MONOS PCT: 5 % (ref 3–12)
NEUTROS ABS: 4.1 10*3/uL (ref 1.7–7.7)
Neutrophils Relative %: 71 % (ref 43–77)
Platelets: 241 10*3/uL (ref 150–400)
RBC: 4.58 MIL/uL (ref 3.87–5.11)
RDW: 14 % (ref 11.5–15.5)
WBC: 5.8 10*3/uL (ref 4.0–10.5)

## 2014-06-24 MED ORDER — CIPROFLOXACIN HCL 250 MG PO TABS: ORAL_TABLET | ORAL | Status: AC

## 2014-06-24 NOTE — Telephone Encounter (Signed)
Tried to call and;rang once and then phone cut off

## 2014-06-24 NOTE — Telephone Encounter (Signed)
Sue Lushndrea, Will you please let patient or daughter know that he only abnormality on her blood work to account for her symptoms would be blood and white blood cells in her urine suspicious for a UTI.  I'll still follow the culture, but i'd recommend she start on ciprofloxacin that I've sent to her [facility].  I'll make sure they're updated once the culture returns.

## 2014-06-25 DIAGNOSIS — IMO0001 Reserved for inherently not codable concepts without codable children: Secondary | ICD-10-CM | POA: Diagnosis not present

## 2014-06-25 DIAGNOSIS — I10 Essential (primary) hypertension: Secondary | ICD-10-CM | POA: Diagnosis not present

## 2014-06-25 DIAGNOSIS — Z993 Dependence on wheelchair: Secondary | ICD-10-CM | POA: Diagnosis not present

## 2014-06-25 DIAGNOSIS — M76899 Other specified enthesopathies of unspecified lower limb, excluding foot: Secondary | ICD-10-CM | POA: Diagnosis not present

## 2014-06-26 LAB — URINE CULTURE: Colony Count: 15000

## 2014-06-26 NOTE — Telephone Encounter (Signed)
Called and husband answered the phone and said to call him back since he was leaving dialysis

## 2014-06-27 DIAGNOSIS — IMO0001 Reserved for inherently not codable concepts without codable children: Secondary | ICD-10-CM | POA: Diagnosis not present

## 2014-06-27 DIAGNOSIS — I10 Essential (primary) hypertension: Secondary | ICD-10-CM | POA: Diagnosis not present

## 2014-06-27 DIAGNOSIS — M76899 Other specified enthesopathies of unspecified lower limb, excluding foot: Secondary | ICD-10-CM | POA: Diagnosis not present

## 2014-06-27 DIAGNOSIS — Z993 Dependence on wheelchair: Secondary | ICD-10-CM | POA: Diagnosis not present

## 2014-06-27 NOTE — Telephone Encounter (Signed)
Pt's husband notified.

## 2014-07-02 DIAGNOSIS — I10 Essential (primary) hypertension: Secondary | ICD-10-CM | POA: Diagnosis not present

## 2014-07-02 DIAGNOSIS — M76899 Other specified enthesopathies of unspecified lower limb, excluding foot: Secondary | ICD-10-CM | POA: Diagnosis not present

## 2014-07-02 DIAGNOSIS — Z993 Dependence on wheelchair: Secondary | ICD-10-CM | POA: Diagnosis not present

## 2014-07-02 DIAGNOSIS — IMO0001 Reserved for inherently not codable concepts without codable children: Secondary | ICD-10-CM | POA: Diagnosis not present

## 2014-07-03 ENCOUNTER — Telehealth: Payer: Self-pay

## 2014-07-03 NOTE — Telephone Encounter (Signed)
Excellent, thank you!

## 2014-07-03 NOTE — Telephone Encounter (Signed)
Ashanti from Home health called and requested a verbal order to continue physical therapy 2 times a week for 3 more weeks.I went ahead and gave her the verbal order, just wanted to make you aware.Rhonda Cunningham,CMA

## 2014-07-07 ENCOUNTER — Telehealth: Payer: Self-pay | Admitting: *Deleted

## 2014-07-07 ENCOUNTER — Telehealth: Payer: Self-pay | Admitting: Family Medicine

## 2014-07-07 DIAGNOSIS — R300 Dysuria: Secondary | ICD-10-CM

## 2014-07-07 NOTE — Telephone Encounter (Signed)
Pt's daughter called and left a message that pt is not feeling well and she has been vomiting every day for about the past two days. Per daughter pt has not ran a fever to her knowlede but she has decreased appetite. Advised her to take her to UC today and if couldn't get to UC ok per Hommel to get on schedule tomorrow. Clydie BraunKaren the daughter voiced underdstanding

## 2014-07-07 NOTE — Telephone Encounter (Signed)
Pt reports continued dysuria, requesting UA but not to be seen.

## 2014-07-08 ENCOUNTER — Encounter: Payer: Self-pay | Admitting: Family Medicine

## 2014-07-08 ENCOUNTER — Ambulatory Visit (INDEPENDENT_AMBULATORY_CARE_PROVIDER_SITE_OTHER): Payer: Medicare Other | Admitting: Family Medicine

## 2014-07-08 VITALS — BP 127/79 | HR 96 | Temp 97.5°F | Wt 143.0 lb

## 2014-07-08 DIAGNOSIS — R300 Dysuria: Secondary | ICD-10-CM

## 2014-07-08 DIAGNOSIS — E785 Hyperlipidemia, unspecified: Secondary | ICD-10-CM

## 2014-07-08 DIAGNOSIS — K5900 Constipation, unspecified: Secondary | ICD-10-CM

## 2014-07-08 DIAGNOSIS — E039 Hypothyroidism, unspecified: Secondary | ICD-10-CM

## 2014-07-08 DIAGNOSIS — F411 Generalized anxiety disorder: Secondary | ICD-10-CM | POA: Diagnosis not present

## 2014-07-08 LAB — POCT URINALYSIS DIPSTICK
Bilirubin, UA: NEGATIVE
Blood, UA: NEGATIVE
Glucose, UA: NEGATIVE
Ketones, UA: NEGATIVE
NITRITE UA: NEGATIVE
PH UA: 7
PROTEIN UA: NEGATIVE
Spec Grav, UA: 1.015
UROBILINOGEN UA: 0.2

## 2014-07-08 MED ORDER — LEVOTHYROXINE SODIUM 50 MCG PO TABS: 50.0000 ug | ORAL_TABLET | Freq: Every day | ORAL | Status: DC

## 2014-07-08 MED ORDER — TIMOLOL HEMIHYDRATE 0.5 % OP SOLN: 1.0000 [drp] | Freq: Two times a day (BID) | OPHTHALMIC | Status: DC

## 2014-07-08 MED ORDER — PRAVASTATIN SODIUM 40 MG PO TABS: 40.0000 mg | ORAL_TABLET | Freq: Every day | ORAL | Status: DC

## 2014-07-08 MED ORDER — ALPRAZOLAM 0.25 MG PO TABS: 0.2500 mg | ORAL_TABLET | Freq: Two times a day (BID) | ORAL | Status: DC | PRN

## 2014-07-08 MED ORDER — CEPHALEXIN 500 MG PO CAPS: 500.0000 mg | ORAL_CAPSULE | Freq: Four times a day (QID) | ORAL | Status: DC

## 2014-07-08 NOTE — Progress Notes (Signed)
CC: Wanda Myers is a 80 y.o. female is here for Urinary Tract Infection   Subjective: HPI:  Accompanied with daughter with concerns of a urinary tract infection has not completely cleared. Patient states she has difficulty describing exactly how she feels but just a sense that she still has a urinary tract infection, there's questionable dysuria and continued fatigue. It sounds like fatigue has improved slightly though since I saw her last after she started on 5 days of Cipro. She reports urinary urgency and continued chronic urinary incontinence. Denies fevers, chills, abdominal pain, flank pain, confusion, night sweats, nor gross blood in urine. Review of systems is positive for vomiting yesterday and the day before that was nonbloody and without coffee ground appearance, she states that her appetite is return if she's not had any nauseousness since going to bed last night  Is requesting refills on levothyroxine without any known intolerance, review of systems is positive for constipation despite use of daily Docusate, she's gone 3 days without a bowel movement. Denies diarrhea, unintentional weight loss or gain.  Requesting refills on pravastatin without any right upper quadrant pain nor myalgias. Denies chest pain or limb claudication  Requesting refills on Xanax that she takes less than most days of the week rarely ever more than one dose a 24-hour period. She states that she uses during periods of stress when she gets in disagreement with her husband. It helps relieve the anxiety accompanied by these disagreements.    Review Of Systems Outlined In HPI  Past Medical History  Diagnosis Date  . A-fib 2002  . Thyroid disease     Past Surgical History  Procedure Laterality Date  . Incontinence surgery  2007  . Kyphoplasty  2013  . Appendectomy  1944   Family History  Problem Relation Age of Onset  . Heart attack Father   . Hyperlipidemia Father   . Hypertension Father      History   Social History  . Marital Status: Married    Spouse Name: N/A    Number of Children: N/A  . Years of Education: N/A   Occupational History  . Not on file.   Social History Main Topics  . Smoking status: Never Smoker   . Smokeless tobacco: Not on file  . Alcohol Use: No  . Drug Use: Not on file  . Sexual Activity: Not on file   Other Topics Concern  . Not on file   Social History Narrative  . No narrative on file     Objective: BP 127/79  Pulse 96  Temp(Src) 97.5 F (36.4 C)  Wt 143 lb (64.864 kg)  General: Alert and Oriented, No Acute Distress HEENT: Pupils equal, round, reactive to light. Conjunctivae clear.  Moist because membranes pharynx unremarkable Lungs: Clear to auscultation bilaterally, no wheezing/ronchi/rales.  Comfortable work of breathing. Good air movement. Cardiac: Regular rate and rhythm. Normal S1/S2.  No murmurs, rubs, nor gallops.   Abdomen: Normal bowel sounds, soft and non tender without palpable masses. Extremities: No peripheral edema.  Strong peripheral pulses.  Mental Status: No depression, anxiety, nor agitation. Skin: Warm and dry.  Assessment & Plan: Wanda Myers was seen today for urinary tract infection.  Diagnoses and associated orders for this visit:  Dysuria - Urinalysis Dipstick - cephALEXin (KEFLEX) 500 MG capsule; Take 1 capsule (500 mg total) by mouth 4 (four) times daily.  Hyperlipidemia - pravastatin (PRAVACHOL) 40 MG tablet; Take 1 tablet (40 mg total) by mouth daily.  Hypothyroidism (acquired) -  levothyroxine (SYNTHROID, LEVOTHROID) 50 MCG tablet; Take 1 tablet (50 mcg total) by mouth daily before breakfast.  Generalized anxiety disorder  Constipation, unspecified constipation type  Other Orders - timolol (BETIMOL) 0.5 % ophthalmic solution; Place 1 drop into both eyes 2 (two) times daily. - ALPRAZolam (XANAX) 0.25 MG tablet; Take 1 tablet (0.25 mg total) by mouth 2 (two) times daily as needed for  anxiety.    Dysuria: Start Keflex pending urine culture Hyperlipidemia: Refilling pravastatin, we will wait until she's feeling better before we ask her to return for fasting blood work Hypothyroidism: Recent TSH was normal, continue current levothyroxine Anxiety: Controlled on as needed use of Xanax Constipation: Continue: Colace start daily as needed psyllium powder   Return if symptoms worsen or fail to improve.

## 2014-07-09 ENCOUNTER — Ambulatory Visit: Payer: PRIVATE HEALTH INSURANCE

## 2014-07-10 DIAGNOSIS — I10 Essential (primary) hypertension: Secondary | ICD-10-CM | POA: Diagnosis not present

## 2014-07-10 DIAGNOSIS — IMO0001 Reserved for inherently not codable concepts without codable children: Secondary | ICD-10-CM | POA: Diagnosis not present

## 2014-07-10 DIAGNOSIS — M76899 Other specified enthesopathies of unspecified lower limb, excluding foot: Secondary | ICD-10-CM | POA: Diagnosis not present

## 2014-07-10 DIAGNOSIS — Z993 Dependence on wheelchair: Secondary | ICD-10-CM | POA: Diagnosis not present

## 2014-07-10 LAB — URINE CULTURE

## 2014-07-16 DIAGNOSIS — I10 Essential (primary) hypertension: Secondary | ICD-10-CM | POA: Diagnosis not present

## 2014-07-16 DIAGNOSIS — M76899 Other specified enthesopathies of unspecified lower limb, excluding foot: Secondary | ICD-10-CM | POA: Diagnosis not present

## 2014-07-16 DIAGNOSIS — Z993 Dependence on wheelchair: Secondary | ICD-10-CM | POA: Diagnosis not present

## 2014-07-16 DIAGNOSIS — IMO0001 Reserved for inherently not codable concepts without codable children: Secondary | ICD-10-CM | POA: Diagnosis not present

## 2014-07-17 DIAGNOSIS — Z993 Dependence on wheelchair: Secondary | ICD-10-CM | POA: Diagnosis not present

## 2014-07-17 DIAGNOSIS — IMO0001 Reserved for inherently not codable concepts without codable children: Secondary | ICD-10-CM | POA: Diagnosis not present

## 2014-07-17 DIAGNOSIS — M76899 Other specified enthesopathies of unspecified lower limb, excluding foot: Secondary | ICD-10-CM | POA: Diagnosis not present

## 2014-07-17 DIAGNOSIS — I10 Essential (primary) hypertension: Secondary | ICD-10-CM | POA: Diagnosis not present

## 2014-07-22 DIAGNOSIS — Z993 Dependence on wheelchair: Secondary | ICD-10-CM | POA: Diagnosis not present

## 2014-07-22 DIAGNOSIS — I10 Essential (primary) hypertension: Secondary | ICD-10-CM | POA: Diagnosis not present

## 2014-07-22 DIAGNOSIS — IMO0001 Reserved for inherently not codable concepts without codable children: Secondary | ICD-10-CM | POA: Diagnosis not present

## 2014-07-22 DIAGNOSIS — M76899 Other specified enthesopathies of unspecified lower limb, excluding foot: Secondary | ICD-10-CM | POA: Diagnosis not present

## 2014-07-23 DIAGNOSIS — M76899 Other specified enthesopathies of unspecified lower limb, excluding foot: Secondary | ICD-10-CM | POA: Diagnosis not present

## 2014-07-23 DIAGNOSIS — I10 Essential (primary) hypertension: Secondary | ICD-10-CM | POA: Diagnosis not present

## 2014-07-23 DIAGNOSIS — Z993 Dependence on wheelchair: Secondary | ICD-10-CM | POA: Diagnosis not present

## 2014-07-23 DIAGNOSIS — IMO0001 Reserved for inherently not codable concepts without codable children: Secondary | ICD-10-CM | POA: Diagnosis not present

## 2014-07-29 DIAGNOSIS — I10 Essential (primary) hypertension: Secondary | ICD-10-CM | POA: Diagnosis not present

## 2014-07-29 DIAGNOSIS — M76899 Other specified enthesopathies of unspecified lower limb, excluding foot: Secondary | ICD-10-CM | POA: Diagnosis not present

## 2014-07-29 DIAGNOSIS — Z993 Dependence on wheelchair: Secondary | ICD-10-CM | POA: Diagnosis not present

## 2014-07-29 DIAGNOSIS — IMO0001 Reserved for inherently not codable concepts without codable children: Secondary | ICD-10-CM | POA: Diagnosis not present

## 2014-07-31 DIAGNOSIS — M76899 Other specified enthesopathies of unspecified lower limb, excluding foot: Secondary | ICD-10-CM | POA: Diagnosis not present

## 2014-07-31 DIAGNOSIS — I10 Essential (primary) hypertension: Secondary | ICD-10-CM | POA: Diagnosis not present

## 2014-07-31 DIAGNOSIS — Z993 Dependence on wheelchair: Secondary | ICD-10-CM | POA: Diagnosis not present

## 2014-07-31 DIAGNOSIS — IMO0001 Reserved for inherently not codable concepts without codable children: Secondary | ICD-10-CM | POA: Diagnosis not present

## 2014-08-27 ENCOUNTER — Institutional Professional Consult (permissible substitution): Payer: PRIVATE HEALTH INSURANCE | Admitting: Cardiology

## 2014-08-29 ENCOUNTER — Other Ambulatory Visit: Payer: Self-pay | Admitting: Sports Medicine

## 2014-08-31 MED ORDER — METOPROLOL TARTRATE 25 MG PO TABS: 25.0000 mg | ORAL_TABLET | Freq: Two times a day (BID) | ORAL | Status: DC

## 2014-09-02 ENCOUNTER — Ambulatory Visit (INDEPENDENT_AMBULATORY_CARE_PROVIDER_SITE_OTHER): Payer: Medicare Other | Admitting: Family Medicine

## 2014-09-02 ENCOUNTER — Encounter: Payer: Self-pay | Admitting: Family Medicine

## 2014-09-02 VITALS — BP 151/85 | HR 85 | Wt 144.0 lb

## 2014-09-02 DIAGNOSIS — N3281 Overactive bladder: Secondary | ICD-10-CM | POA: Diagnosis not present

## 2014-09-02 DIAGNOSIS — I10 Essential (primary) hypertension: Secondary | ICD-10-CM | POA: Diagnosis not present

## 2014-09-02 DIAGNOSIS — Z283 Underimmunization status: Secondary | ICD-10-CM

## 2014-09-02 DIAGNOSIS — I482 Chronic atrial fibrillation, unspecified: Secondary | ICD-10-CM

## 2014-09-02 DIAGNOSIS — H409 Unspecified glaucoma: Secondary | ICD-10-CM

## 2014-09-02 DIAGNOSIS — F411 Generalized anxiety disorder: Secondary | ICD-10-CM

## 2014-09-02 DIAGNOSIS — Z23 Encounter for immunization: Secondary | ICD-10-CM | POA: Diagnosis not present

## 2014-09-02 DIAGNOSIS — Z2839 Other underimmunization status: Secondary | ICD-10-CM

## 2014-09-02 MED ORDER — METOPROLOL TARTRATE 25 MG PO TABS: 25.0000 mg | ORAL_TABLET | Freq: Two times a day (BID) | ORAL | Status: DC

## 2014-09-02 MED ORDER — MIRABEGRON ER 25 MG PO TB24: 25.0000 mg | ORAL_TABLET | Freq: Every day | ORAL | Status: DC

## 2014-09-02 MED ORDER — LATANOPROST 0.005 % OP SOLN: 1.0000 [drp] | Freq: Every day | OPHTHALMIC | Status: DC

## 2014-09-02 MED ORDER — ALPRAZOLAM 0.25 MG PO TABS: 0.2500 mg | ORAL_TABLET | Freq: Two times a day (BID) | ORAL | Status: DC | PRN

## 2014-09-02 MED ORDER — TIMOLOL HEMIHYDRATE 0.5 % OP SOLN: 1.0000 [drp] | Freq: Two times a day (BID) | OPHTHALMIC | Status: DC

## 2014-09-02 NOTE — Progress Notes (Signed)
CC: Wanda Myers is a 80 y.o. female is here for medication f/u   Subjective: HPI:  Followup essential hypertension: She ran out of metoprolol last week and just got restarted on Monday. Currently taking 25 mg twice a day without any outside blood pressures to report. Denies chest pain, shortness of breath or orthopnea. She reports racing heart most days of the week when she was out of medication which was only improved with Xanax, has now resolved as of Monday.  Followup atrial fibrillation: Continues to use aspirin on a daily basis for her only anticoagulation. She and her daughter are fearful that due to a fall risk Coumadin or new or anticoagulation poses more harm than risk.  Overactive bladder: She reports that myrbetriq was providing her some benefit when he came to her overactive bladder and urge incontinence. She stopped taking his medication in August because refills ran out and she was unaware that this is something she could take indefinitely. She denies any known side effects we'll taking this medication. She reports that she has to wear a diaper 24 hours a day and reports leakage as mild to moderate in severity without any fecal incontinence. Denies any other genitourinary complaints  Followup anxiety: Patient states that since the passing of her husband 2 weeks ago she's had worsening anxiety that is worse first thing in the morning and late at night. Symptoms are improving she takes Xanax. She describes symptoms as rapid heartbeat, tremor, and subjective anxiety. Symptoms are currently mild to moderate in severity present on a daily basis.  She is requesting refills on her glaucoma medications, she sees her ophthalmologist every 6 months and denies any new ocular complaints   Review Of Systems Outlined In HPI  Past Medical History  Diagnosis Date  . A-fib 2002  . Thyroid disease     Past Surgical History  Procedure Laterality Date  . Incontinence surgery  2007  .  Kyphoplasty  2013  . Appendectomy  1944   Family History  Problem Relation Age of Onset  . Heart attack Father   . Hyperlipidemia Father   . Hypertension Father     History   Social History  . Marital Status: Married    Spouse Name: N/A    Number of Children: N/A  . Years of Education: N/A   Occupational History  . Not on file.   Social History Main Topics  . Smoking status: Never Smoker   . Smokeless tobacco: Not on file  . Alcohol Use: No  . Drug Use: Not on file  . Sexual Activity: Not on file   Other Topics Concern  . Not on file   Social History Narrative  . No narrative on file     Objective: BP 151/85  Pulse 85  Wt 144 lb (65.318 kg)  General: Alert and Oriented, No Acute Distress HEENT: Pupils equal, round, reactive to light. Conjunctivae clear.  Moist membranes pharynx unremarkable Lungs: Clear to auscultation bilaterally, no wheezing/ronchi/rales.  Comfortable work of breathing. Good air movement. Cardiac: Irregular irregular rhythm less than 100 beats per minute Normal S1/S2.  No murmurs, rubs, nor gallops.   Abdomen: Soft and flat Extremities: No peripheral edema.  Strong peripheral pulses.  Mental Status: No depression, anxiety, nor agitation. Skin: Warm and dry.  Assessment & Plan: Wanda Myers was seen today for medication f/u.  Diagnoses and associated orders for this visit:  Immunization deficiency - Pneumococcal conjugate vaccine 13-valent  Essential hypertension, benign - metoprolol tartrate (LOPRESSOR) 25  MG tablet; Take 1 tablet (25 mg total) by mouth 2 (two) times daily.  Chronic atrial fibrillation - metoprolol tartrate (LOPRESSOR) 25 MG tablet; Take 1 tablet (25 mg total) by mouth 2 (two) times daily.  Overactive bladder - mirabegron ER (MYRBETRIQ) 25 MG TB24 tablet; Take 1 tablet (25 mg total) by mouth daily.  Generalized anxiety disorder - ALPRAZolam (XANAX) 0.25 MG tablet; Take 1 tablet (0.25 mg total) by mouth 2 (two) times  daily as needed for anxiety.  Glaucoma - timolol (BETIMOL) 0.5 % ophthalmic solution; Place 1 drop into both eyes 2 (two) times daily. - latanoprost (XALATAN) 0.005 % ophthalmic solution; Place 1 drop into both eyes at bedtime.    Essential hypertension: Uncontrolled, since she just restarted Lopressor no change to medication regimen unless outside blood pressures consistently above 140/90, she's done quite well on this regimen in the past. Atrial fibrillation: Currently rate controlled, agree with patient's decision to go no further with anticoagulation beyond aspirin Overactive bladder: Uncontrolled restart mybertriq Anxiety: Controlled with as needed alprazolam Glaucoma: Continue to see ophthalmology every 6 months, refills provided  Return in about 3 months (around 12/03/2014) for BP, Pulse, Cholesterol, Thyroid.

## 2014-09-05 DIAGNOSIS — M2042 Other hammer toe(s) (acquired), left foot: Secondary | ICD-10-CM | POA: Diagnosis not present

## 2014-09-05 DIAGNOSIS — L84 Corns and callosities: Secondary | ICD-10-CM | POA: Diagnosis not present

## 2014-09-05 DIAGNOSIS — M2041 Other hammer toe(s) (acquired), right foot: Secondary | ICD-10-CM | POA: Diagnosis not present

## 2014-09-05 DIAGNOSIS — B351 Tinea unguium: Secondary | ICD-10-CM | POA: Diagnosis not present

## 2014-10-24 ENCOUNTER — Ambulatory Visit (INDEPENDENT_AMBULATORY_CARE_PROVIDER_SITE_OTHER): Payer: Medicare Other | Admitting: Family Medicine

## 2014-10-24 ENCOUNTER — Encounter: Payer: Self-pay | Admitting: Family Medicine

## 2014-10-24 VITALS — BP 138/89 | HR 87

## 2014-10-24 DIAGNOSIS — F329 Major depressive disorder, single episode, unspecified: Secondary | ICD-10-CM

## 2014-10-24 DIAGNOSIS — R531 Weakness: Secondary | ICD-10-CM

## 2014-10-24 DIAGNOSIS — F32A Depression, unspecified: Secondary | ICD-10-CM

## 2014-10-24 MED ORDER — SERTRALINE HCL 50 MG PO TABS: 50.0000 mg | ORAL_TABLET | Freq: Every day | ORAL | Status: DC

## 2014-10-24 NOTE — Progress Notes (Signed)
CC: Wanda Myers is a 80 y.o. female is here for Fatigue   Subjective: HPI:  Accompanied by daughter Wanda Myers  Patient reports weakness that has been worsening over the past 2-3 days. She describes it as diffuse involving every muscle in her body. She describes that she is having difficulty getting out of bed and when standing will have shaking in her hands and legs due to the weakness. She has not fallen but feels unsteady when standing. This has been accompanied by decreased appetite without any abdominal pain or nausea. The daughter states that patient has been feeling much more blue and emotional. Patient admits that she is more emotional than normal when thinking about Christmas and her late husband. Other than weakness and sadness patient states that she is in her regular state of health.  She denies fevers, chills, cough, shortness of breath, abdominal pain, nausea, diarrhea nor constipation. Denies skin changes nasal congestion or ear pain. Denies any genitourinary complaints  Review Of Systems Outlined In HPI  Past Medical History  Diagnosis Date  . A-fib 2002  . Thyroid disease     Past Surgical History  Procedure Laterality Date  . Incontinence surgery  2007  . Kyphoplasty  2013  . Appendectomy  1944   Family History  Problem Relation Age of Onset  . Heart attack Father   . Hyperlipidemia Father   . Hypertension Father     History   Social History  . Marital Status: Married    Spouse Name: N/A    Number of Children: N/A  . Years of Education: N/A   Occupational History  . Not on file.   Social History Main Topics  . Smoking status: Never Smoker   . Smokeless tobacco: Not on file  . Alcohol Use: No  . Drug Use: Not on file  . Sexual Activity: Not on file   Other Topics Concern  . Not on file   Social History Narrative     Objective: BP 138/89 mmHg  Pulse 87  SpO2 99%  General: Alert and Oriented, No Acute Distress HEENT: Pupils equal, round,  reactive to light. Conjunctivae clear.  External ears unremarkable, canals clear with intact TMs with appropriate landmarks.  Middle ear appears open without effusion. Pink inferior turbinates.  Moist mucous membranes, pharynx without inflammation nor lesions.  Neck supple without palpable lymphadenopathy nor abnormal masses. Lungs: Clear to auscultation bilaterally, no wheezing/ronchi/rales.  Comfortable work of breathing. Good air movement. Cardiac: Irregularly irregular rhythm. Normal S1/S2.  No murmurs, rubs, nor gallops.   Extremities: No peripheral edema.  Strong peripheral pulses.  Mental Status: Mild to moderate depression without anxiety nor agitation Skin: Warm and dry.  Assessment & Plan: Wanda Myers was seen today for fatigue.  Diagnoses and associated orders for this visit:  Depression - sertraline (ZOLOFT) 50 MG tablet; Take 1 tablet (50 mg total) by mouth daily.  Weakness - Sed Rate (ESR) - C-reactive protein - COMPLETE METABOLIC PANEL WITH GFR    Depression: Begin Zoloft, offered referral to counselor however patient politely declines Weakness: I believe that this is most likely due to poor nutrition recently with her worsening depression and that this will improve once depression is improved. Rule out metabolic abnormality and her decreased oral intake. Checking ESR and CRP to rule out polymyalgia rheumatica.  Return in about 4 weeks (around 11/21/2014).

## 2014-10-25 LAB — COMPLETE METABOLIC PANEL WITH GFR
ALBUMIN: 3.6 g/dL (ref 3.5–5.2)
ALK PHOS: 44 U/L (ref 39–117)
ALT: 16 U/L (ref 0–35)
AST: 25 U/L (ref 0–37)
BUN: 18 mg/dL (ref 6–23)
CO2: 29 meq/L (ref 19–32)
Calcium: 9.2 mg/dL (ref 8.4–10.5)
Chloride: 99 mEq/L (ref 96–112)
Creat: 1.03 mg/dL (ref 0.50–1.10)
GFR, EST AFRICAN AMERICAN: 56 mL/min — AB
GFR, EST NON AFRICAN AMERICAN: 48 mL/min — AB
Glucose, Bld: 181 mg/dL — ABNORMAL HIGH (ref 70–99)
Potassium: 3.8 mEq/L (ref 3.5–5.3)
Sodium: 139 mEq/L (ref 135–145)
Total Bilirubin: 0.9 mg/dL (ref 0.2–1.2)
Total Protein: 6.3 g/dL (ref 6.0–8.3)

## 2014-10-25 LAB — SEDIMENTATION RATE: Sed Rate: 6 mm/hr (ref 0–22)

## 2014-10-25 LAB — C-REACTIVE PROTEIN: CRP: <0.5 mg/dL (ref <0.60)

## 2014-10-27 ENCOUNTER — Telehealth: Payer: Self-pay | Admitting: Family Medicine

## 2014-10-27 DIAGNOSIS — R739 Hyperglycemia, unspecified: Secondary | ICD-10-CM

## 2014-10-27 NOTE — Telephone Encounter (Signed)
Sue LushAndrea, Will you please let Ms. Crisp's daughter, Clydie BraunKaren @ [phone], know that her inflammatory markers were normal ruling out the condition I mentioned during her visit on Friday.  The only abnormality was a moderately elevated blood sugar therefore I'd recommend having an A1c checked at her convenience.  Lab slip placed in your inbox.

## 2014-10-28 NOTE — Telephone Encounter (Signed)
Pt's daughter notified.

## 2014-11-06 ENCOUNTER — Other Ambulatory Visit: Payer: Self-pay | Admitting: Family Medicine

## 2014-11-06 DIAGNOSIS — K5900 Constipation, unspecified: Secondary | ICD-10-CM

## 2014-11-07 ENCOUNTER — Other Ambulatory Visit: Payer: Self-pay

## 2014-11-07 DIAGNOSIS — H409 Unspecified glaucoma: Secondary | ICD-10-CM

## 2014-11-07 DIAGNOSIS — F411 Generalized anxiety disorder: Secondary | ICD-10-CM

## 2014-11-07 MED ORDER — ALPRAZOLAM 0.25 MG PO TABS: 0.2500 mg | ORAL_TABLET | Freq: Two times a day (BID) | ORAL | Status: DC | PRN

## 2014-11-07 MED ORDER — TIMOLOL MALEATE 0.5 % OP SOLN: 1.0000 [drp] | Freq: Two times a day (BID) | OPHTHALMIC | Status: DC

## 2014-11-07 MED ORDER — LATANOPROST 0.005 % OP SOLN: 1.0000 [drp] | Freq: Every day | OPHTHALMIC | Status: DC

## 2014-11-10 DIAGNOSIS — R739 Hyperglycemia, unspecified: Secondary | ICD-10-CM | POA: Diagnosis not present

## 2014-11-11 ENCOUNTER — Encounter: Payer: Self-pay | Admitting: Family Medicine

## 2014-11-11 DIAGNOSIS — E119 Type 2 diabetes mellitus without complications: Secondary | ICD-10-CM | POA: Insufficient documentation

## 2014-11-11 LAB — HEMOGLOBIN A1C
HEMOGLOBIN A1C: 7.1 % — AB (ref <5.7)
Mean Plasma Glucose: 157 mg/dL — ABNORMAL HIGH (ref <117)

## 2014-12-02 DIAGNOSIS — H524 Presbyopia: Secondary | ICD-10-CM | POA: Diagnosis not present

## 2014-12-02 DIAGNOSIS — H4011X4 Primary open-angle glaucoma, indeterminate stage: Secondary | ICD-10-CM | POA: Diagnosis not present

## 2014-12-11 ENCOUNTER — Other Ambulatory Visit: Payer: Self-pay | Admitting: Family Medicine

## 2014-12-29 DIAGNOSIS — H4011X3 Primary open-angle glaucoma, severe stage: Secondary | ICD-10-CM | POA: Diagnosis not present

## 2014-12-31 ENCOUNTER — Other Ambulatory Visit: Payer: Self-pay | Admitting: Family Medicine

## 2015-01-07 ENCOUNTER — Other Ambulatory Visit: Payer: Self-pay | Admitting: Family Medicine

## 2015-01-19 ENCOUNTER — Other Ambulatory Visit: Payer: Self-pay | Admitting: Family Medicine

## 2015-02-02 DIAGNOSIS — H4011X2 Primary open-angle glaucoma, moderate stage: Secondary | ICD-10-CM | POA: Diagnosis not present

## 2015-02-14 ENCOUNTER — Other Ambulatory Visit: Payer: Self-pay | Admitting: Family Medicine

## 2015-03-09 ENCOUNTER — Ambulatory Visit (INDEPENDENT_AMBULATORY_CARE_PROVIDER_SITE_OTHER): Payer: Medicare Other | Admitting: Family Medicine

## 2015-03-09 ENCOUNTER — Encounter: Payer: Self-pay | Admitting: Family Medicine

## 2015-03-09 VITALS — BP 144/88 | HR 77 | Wt 143.0 lb

## 2015-03-09 DIAGNOSIS — N189 Chronic kidney disease, unspecified: Secondary | ICD-10-CM | POA: Diagnosis not present

## 2015-03-09 DIAGNOSIS — I10 Essential (primary) hypertension: Secondary | ICD-10-CM | POA: Diagnosis not present

## 2015-03-09 DIAGNOSIS — E1122 Type 2 diabetes mellitus with diabetic chronic kidney disease: Secondary | ICD-10-CM | POA: Diagnosis not present

## 2015-03-09 DIAGNOSIS — E039 Hypothyroidism, unspecified: Secondary | ICD-10-CM

## 2015-03-09 DIAGNOSIS — R5382 Chronic fatigue, unspecified: Secondary | ICD-10-CM

## 2015-03-09 DIAGNOSIS — F411 Generalized anxiety disorder: Secondary | ICD-10-CM

## 2015-03-09 DIAGNOSIS — R531 Weakness: Secondary | ICD-10-CM

## 2015-03-09 DIAGNOSIS — E1129 Type 2 diabetes mellitus with other diabetic kidney complication: Secondary | ICD-10-CM | POA: Diagnosis not present

## 2015-03-09 LAB — POCT URINALYSIS DIPSTICK
Bilirubin, UA: NEGATIVE
Glucose, UA: NEGATIVE
Ketones, UA: NEGATIVE
Nitrite, UA: NEGATIVE
Protein, UA: NEGATIVE
Spec Grav, UA: 1.015
Urobilinogen, UA: 0.2
pH, UA: 6.5

## 2015-03-09 LAB — POCT UA - MICROALBUMIN

## 2015-03-09 MED ORDER — METOPROLOL TARTRATE 25 MG PO TABS: 25.0000 mg | ORAL_TABLET | Freq: Two times a day (BID) | ORAL | Status: DC

## 2015-03-09 MED ORDER — PRAVASTATIN SODIUM 40 MG PO TABS: 40.0000 mg | ORAL_TABLET | Freq: Every day | ORAL | Status: DC

## 2015-03-09 MED ORDER — SERTRALINE HCL 100 MG PO TABS: 100.0000 mg | ORAL_TABLET | Freq: Every day | ORAL | Status: DC

## 2015-03-09 NOTE — Progress Notes (Signed)
CC: Wanda Myers is a 81 y.o. female is here for Diabetes   Subjective: HPI:  Follow-up hypothyroidism: Continues to take 50 g of levothyroxine on a daily basis. Since I saw her last she  Reports continued fatigue, decreased stamina, and lack of interest in pleasurable activities. There's been no unintentional weight gain or loss. She's had some decreased appetite that has not improved since I saw her last however she tells me she has a constant appetite for key lime pie.  Follow-up essential hypertension: No outside blood pressures to report. Has run out of metoprolol requesting refills. Denies chest pain shortness of breath orthopnea nor peripheral edema  Follow-up type 2 diabetes: No outside blood sugars to report. She is not currently on any  Anti-hyperglycemics. No polyuria plication or polydipsia.  Follow-up anxiety: She tells me that she still feels mildly to moderately anxious about nothing in particular but also about everything at the same time. Symptoms have worsened since the passing of her husband. Slight improvement since starting Zoloft without any side effects.  With respect to her weakness she denies any focal site of weakness, shortness of breath, nor muscle atrophy. She feels like a lot of her weakness is due to poor motivation and subjective depression and anxiety.   Review Of Systems Outlined In HPI  Past Medical History  Diagnosis Date  . A-fib 2002  . Thyroid disease     Past Surgical History  Procedure Laterality Date  . Incontinence surgery  2007  . Kyphoplasty  2013  . Appendectomy  1944   Family History  Problem Relation Age of Onset  . Heart attack Father   . Hyperlipidemia Father   . Hypertension Father     History   Social History  . Marital Status: Married    Spouse Name: N/A  . Number of Children: N/A  . Years of Education: N/A   Occupational History  . Not on file.   Social History Main Topics  . Smoking status: Never Smoker   .  Smokeless tobacco: Not on file  . Alcohol Use: No  . Drug Use: Not on file  . Sexual Activity: Not on file   Other Topics Concern  . Not on file   Social History Narrative     Objective: BP 144/88 mmHg  Pulse 77  Wt 143 lb (64.864 kg)  General: Alert and Oriented, No Acute Distress HEENT: Pupils equal, round, reactive to light. Conjunctivae clear.  Moist mucous membranes pharynx unremarkable Lungs: Clear to auscultation bilaterally, no wheezing/ronchi/rales.  Comfortable work of breathing. Good air movement. Cardiac: Regular rate and rhythm. Normal S1/S2.  No murmurs, rubs, nor gallops.   Extremities: No peripheral edema.  Strong peripheral pulses. Full range of motion and strengthen all 4 extremities Mental Status: mild depression with no anxiety, nor agitation. Skin: Warm and dry.  Assessment & Plan: Wanda Myers was seen today for diabetes.  Diagnoses and all orders for this visit:  Hypothyroidism (acquired) Orders: -     TSH  Essential hypertension, benign  Type 2 diabetes mellitus with diabetic chronic kidney disease Orders: -     Hemoglobin A1c -     POCT UA - Microalbumin  Generalized anxiety disorder  Chronic fatigue Orders: -     Vit D  25 hydroxy (rtn osteoporosis monitoring) -     Vitamin B12 -     COMPLETE METABOLIC PANEL WITH GFR -     CBC  Weakness Orders: -     Vit D  25 hydroxy (rtn osteoporosis monitoring) -     Vitamin B12 -     COMPLETE METABOLIC PANEL WITH GFR -     CBC -     Urinalysis Dipstick  Other orders -     metoprolol tartrate (LOPRESSOR) 25 MG tablet; Take 1 tablet (25 mg total) by mouth 2 (two) times daily. -     Cancel: sertraline (ZOLOFT) 50 MG tablet; Take 1 tablet (50 mg total) by mouth daily. -     pravastatin (PRAVACHOL) 40 MG tablet; Take 1 tablet (40 mg total) by mouth daily. -     sertraline (ZOLOFT) 100 MG tablet; Take 1 tablet (100 mg total) by mouth daily. -     POCT HgB A1C  hypothyroidism:questionable uncontrolled  given recent fatigue, checking TSH and will need  Refills on levothyroxine Essential hypertension: Uncontrolled after  Running out of metoprolol, restart former regimen Type 2 diabetes: Clinically controlled but due for A1c, labs have been ordered Generalized anxiety and depression: I suspect this is uncontrolled and shooting greatly to her weakness and lack of motivation therefore increasing Zoloft Fatigue and weakness: Rule out vitamin deficiency metabolic abnormality or anemia.   Return in about 3 months (around 06/08/2015).

## 2015-03-10 ENCOUNTER — Telehealth: Payer: Self-pay | Admitting: Family Medicine

## 2015-03-10 ENCOUNTER — Telehealth: Payer: Self-pay | Admitting: *Deleted

## 2015-03-10 ENCOUNTER — Encounter: Payer: Self-pay | Admitting: *Deleted

## 2015-03-10 LAB — CBC
HEMATOCRIT: 38.8 % (ref 36.0–46.0)
HEMOGLOBIN: 12.5 g/dL (ref 12.0–15.0)
MCH: 29 pg (ref 26.0–34.0)
MCHC: 32.2 g/dL (ref 30.0–36.0)
MCV: 90 fL (ref 78.0–100.0)
MPV: 10.1 fL (ref 8.6–12.4)
PLATELETS: 162 10*3/uL (ref 150–400)
RBC: 4.31 MIL/uL (ref 3.87–5.11)
RDW: 14.4 % (ref 11.5–15.5)
WBC: 5.5 10*3/uL (ref 4.0–10.5)

## 2015-03-10 LAB — COMPLETE METABOLIC PANEL WITH GFR
ALK PHOS: 51 U/L (ref 39–117)
ALT: 17 U/L (ref 0–35)
AST: 27 U/L (ref 0–37)
Albumin: 3.7 g/dL (ref 3.5–5.2)
BUN: 23 mg/dL (ref 6–23)
CALCIUM: 8.8 mg/dL (ref 8.4–10.5)
CHLORIDE: 103 meq/L (ref 96–112)
CO2: 28 mEq/L (ref 19–32)
Creat: 0.94 mg/dL (ref 0.50–1.10)
GFR, Est African American: 62 mL/min
GFR, Est Non African American: 54 mL/min — ABNORMAL LOW
Glucose, Bld: 103 mg/dL — ABNORMAL HIGH (ref 70–99)
POTASSIUM: 3.7 meq/L (ref 3.5–5.3)
SODIUM: 141 meq/L (ref 135–145)
TOTAL PROTEIN: 6.2 g/dL (ref 6.0–8.3)
Total Bilirubin: 0.6 mg/dL (ref 0.2–1.2)

## 2015-03-10 LAB — VITAMIN B12: VITAMIN B 12: 540 pg/mL (ref 211–911)

## 2015-03-10 LAB — HEMOGLOBIN A1C
Hgb A1c MFr Bld: 7.1 % — ABNORMAL HIGH (ref <5.7)
Mean Plasma Glucose: 157 mg/dL — ABNORMAL HIGH (ref <117)

## 2015-03-10 LAB — VITAMIN D 25 HYDROXY (VIT D DEFICIENCY, FRACTURES): Vit D, 25-Hydroxy: 30 ng/mL (ref 30–100)

## 2015-03-10 LAB — TSH: TSH: 2.096 u[IU]/mL (ref 0.350–4.500)

## 2015-03-10 MED ORDER — LEVOTHYROXINE SODIUM 50 MCG PO TABS: ORAL_TABLET | ORAL | Status: DC

## 2015-03-10 MED ORDER — CIPROFLOXACIN HCL 250 MG PO TABS: ORAL_TABLET | ORAL | Status: DC

## 2015-03-10 NOTE — Telephone Encounter (Signed)
Pt's daughter notified that pt does not have a UTI. The labs were entered in on the wrong patient. While speaking with daughter she states that pt has a diarrhea on off througout the day. Denies fever, abd pain or bloody stoools. Per daughter pt has a hx of diverticulitis. Advised to continue to monitor and hydrate. If she develops new sxs such as bloody stools, and pain, fever ect to call us back. Daughter voiced understanding

## 2015-03-10 NOTE — Telephone Encounter (Signed)
Wanda Myers, Will you please let patient's family know that her urine tests showed she has a mild UTI and I'd recommend she start taking a 5 day course of ciprofloxacin that I [facility]. Her thyroid supplementation appears to be normal so I've also sent refills of levothyroxine to the pharmacy.  Her Vitamin B12 and D levels were normal and her blood sugar is stable.  No signs of anemia.  I suspect a lot of her fatigue is due to depression so I'd recommend continuing with the plan of the new dose of sertraline at 100mg  daily  I'd recommend f/u in 3 months.

## 2015-03-10 NOTE — Telephone Encounter (Signed)
The u/a results and microalbumin results dated 03/09/2015 were placed in the chart in error. These labs do not belong to this patient. The pharmacist was spoken with directly and notified to cancel the rx for cipro. The correct labs have been inputted in the correct patients chart. Wanda Myers has not yet been contacted about her lab results.

## 2015-03-10 NOTE — Telephone Encounter (Signed)
Molli HazardMatthew,  Can you please take the charge out for the microalbumin and the u/a on this patient?  Thanks!

## 2015-03-10 NOTE — Telephone Encounter (Signed)
Noted, the only change from the phone note from 03/10/2015 regarding lab results is that there is no need for her to take cipro.

## 2015-03-10 NOTE — Telephone Encounter (Signed)
Spoke to patient daughter gave her information as noted below. Rhonda Cunningham,CMA

## 2015-03-11 NOTE — Telephone Encounter (Signed)
Ok will do thanks.

## 2015-03-15 ENCOUNTER — Telehealth: Payer: Self-pay | Admitting: Family Medicine

## 2015-03-15 NOTE — Telephone Encounter (Signed)
Call from nurse Health after hours. Son came back in town and says antibiotic for UTI was never called in.  I looked in chart. The daughter was informted the uti results were actually on another patient. So no need for antibiotics.

## 2015-04-06 DIAGNOSIS — H4011X2 Primary open-angle glaucoma, moderate stage: Secondary | ICD-10-CM | POA: Diagnosis not present

## 2015-06-08 ENCOUNTER — Encounter: Payer: Self-pay | Admitting: Family Medicine

## 2015-06-08 ENCOUNTER — Ambulatory Visit: Payer: Medicare Other | Admitting: Family Medicine

## 2015-06-08 ENCOUNTER — Ambulatory Visit (INDEPENDENT_AMBULATORY_CARE_PROVIDER_SITE_OTHER): Payer: Medicare Other | Admitting: Family Medicine

## 2015-06-08 VITALS — BP 132/75 | HR 52 | Wt 147.0 lb

## 2015-06-08 DIAGNOSIS — N189 Chronic kidney disease, unspecified: Secondary | ICD-10-CM | POA: Diagnosis not present

## 2015-06-08 DIAGNOSIS — I482 Chronic atrial fibrillation, unspecified: Secondary | ICD-10-CM

## 2015-06-08 DIAGNOSIS — E1122 Type 2 diabetes mellitus with diabetic chronic kidney disease: Secondary | ICD-10-CM

## 2015-06-08 DIAGNOSIS — I10 Essential (primary) hypertension: Secondary | ICD-10-CM | POA: Diagnosis not present

## 2015-06-08 LAB — POCT GLYCOSYLATED HEMOGLOBIN (HGB A1C): HEMOGLOBIN A1C: 7

## 2015-06-08 MED ORDER — ZOSTER VACCINE LIVE 19400 UNT/0.65ML ~~LOC~~ SOLR: 0.6500 mL | Freq: Once | SUBCUTANEOUS | Status: DC

## 2015-06-08 NOTE — Progress Notes (Signed)
CC: Rozelle LoganFrances Cornforth is a 81 y.o. female is here for 3 month f/u   Subjective: HPI:   Follow-up essential hypertension: Since increasing metoprolol she denies any known side effects. No outside blood pressures to report. Denies chest pain shortness of breath orthopnea nor peripheral edema  Follow-up type 2 diabetes: No outside blood sugars to report. Denies polyuria polyphagia or polydipsia. Denies poorly healing wounds or vision loss. Currently not following any specific dietary restrictions.  Follow-up atrial fibrillation: Denies any new motor or sensory disturbances nor irregular heartbeat. Denies rapid heartbeat. No lightheadedness   Review Of Systems Outlined In HPI  Past Medical History  Diagnosis Date  . A-fib 2002  . Thyroid disease     Past Surgical History  Procedure Laterality Date  . Incontinence surgery  2007  . Kyphoplasty  2013  . Appendectomy  1944   Family History  Problem Relation Age of Onset  . Heart attack Father   . Hyperlipidemia Father   . Hypertension Father     History   Social History  . Marital Status: Married    Spouse Name: N/A  . Number of Children: N/A  . Years of Education: N/A   Occupational History  . Not on file.   Social History Main Topics  . Smoking status: Never Smoker   . Smokeless tobacco: Not on file  . Alcohol Use: No  . Drug Use: Not on file  . Sexual Activity: Not on file   Other Topics Concern  . Not on file   Social History Narrative     Objective: BP 132/75 mmHg  Pulse 52  Wt 147 lb (66.679 kg)  General: Alert and Oriented, No Acute Distress HEENT: Pupils equal, round, reactive to light. Conjunctivae clear.  Moistness membranes Lungs: Clear to auscultation bilaterally, no wheezing/ronchi/rales.  Comfortable work of breathing. Good air movement. Cardiac:irregularly irregular rhythm at a rate of less than 100   Abdomen: Normal bowel sounds, soft and non tender without palpable masses. Extremities: No  peripheral edema.  Strong peripheral pulses.  Mental Status: No depression, anxiety, nor agitation. Skin: Warm and dry.  Assessment & Plan: [name] was seen today for 3 month f/u.  Diagnoses and all orders for this visit:  Essential hypertension, benign  Type 2 diabetes mellitus with diabetic chronic kidney disease Orders: -     POCT HgB A1C  Chronic atrial fibrillation  Other orders -     zoster vaccine live, PF, (ZOSTAVAX) [id number] UNT/0.65ML injection; Inject 19,400 Units into the skin once.   Essential hypertension: Controlled continue metoprolol Type 2 diabetes: A1c of 7.0, controlled, continue to focus on keeping carbohydrate intake to a minimum, advise she's doing pretty good job of this to begin with Chronic atrial fibrillation: rate controlled, understandably she does not want to be on any anticoagulation due to her history of a significant GI bleed.  She is also overdue for Zostavax, given her prescription to take to her pharmacy if this is affordable  Return in about 3 months (around 09/08/2015).

## 2015-06-16 ENCOUNTER — Ambulatory Visit: Payer: Medicare Other | Admitting: Family Medicine

## 2015-07-08 ENCOUNTER — Other Ambulatory Visit: Payer: Self-pay | Admitting: *Deleted

## 2015-07-08 DIAGNOSIS — F411 Generalized anxiety disorder: Secondary | ICD-10-CM

## 2015-07-08 MED ORDER — ALPRAZOLAM 0.25 MG PO TABS: 0.2500 mg | ORAL_TABLET | Freq: Two times a day (BID) | ORAL | Status: DC | PRN

## 2015-07-10 ENCOUNTER — Other Ambulatory Visit: Payer: Self-pay | Admitting: Family Medicine

## 2015-08-10 DIAGNOSIS — H3531 Nonexudative age-related macular degeneration: Secondary | ICD-10-CM | POA: Diagnosis not present

## 2015-08-10 DIAGNOSIS — Z961 Presence of intraocular lens: Secondary | ICD-10-CM | POA: Diagnosis not present

## 2015-08-10 DIAGNOSIS — H4011X2 Primary open-angle glaucoma, moderate stage: Secondary | ICD-10-CM | POA: Diagnosis not present

## 2015-08-10 DIAGNOSIS — H4011X3 Primary open-angle glaucoma, severe stage: Secondary | ICD-10-CM | POA: Diagnosis not present

## 2015-09-08 ENCOUNTER — Encounter: Payer: Self-pay | Admitting: Family Medicine

## 2015-09-08 ENCOUNTER — Ambulatory Visit (INDEPENDENT_AMBULATORY_CARE_PROVIDER_SITE_OTHER): Payer: Medicare Other | Admitting: Family Medicine

## 2015-09-08 VITALS — BP 151/98 | HR 80 | Wt 139.0 lb

## 2015-09-08 DIAGNOSIS — E039 Hypothyroidism, unspecified: Secondary | ICD-10-CM | POA: Diagnosis not present

## 2015-09-08 DIAGNOSIS — I482 Chronic atrial fibrillation, unspecified: Secondary | ICD-10-CM

## 2015-09-08 DIAGNOSIS — N183 Chronic kidney disease, stage 3 (moderate): Secondary | ICD-10-CM

## 2015-09-08 DIAGNOSIS — Z23 Encounter for immunization: Secondary | ICD-10-CM | POA: Diagnosis not present

## 2015-09-08 DIAGNOSIS — R5383 Other fatigue: Secondary | ICD-10-CM

## 2015-09-08 DIAGNOSIS — E1122 Type 2 diabetes mellitus with diabetic chronic kidney disease: Secondary | ICD-10-CM

## 2015-09-08 DIAGNOSIS — E559 Vitamin D deficiency, unspecified: Secondary | ICD-10-CM | POA: Diagnosis not present

## 2015-09-08 DIAGNOSIS — I10 Essential (primary) hypertension: Secondary | ICD-10-CM

## 2015-09-08 LAB — POCT GLYCOSYLATED HEMOGLOBIN (HGB A1C): Hemoglobin A1C: 7.3

## 2015-09-08 NOTE — Progress Notes (Signed)
CC: Wanda LoganFrances Myers is a 81 y.o. female is here for Diabetes and Hypertension   Subjective: HPI:  Follow-up atrial fibrillation: Taking metoprolol twice a day as prescribed. Denies rapid heartbeat, shortness of breath, chest pain or lightheadedness.  Follow-up essential hypertension: Taking metoprolol twice a day as prescribed. Family members are taking blood pressure occasionally at home and all numbers are in the normotensive range. Denies chest pain shortness of breath orthopnea nor peripheral edema  Follow-up hyperthyroidism: Continues to take levothyroxine as prescribed. Denies constipation, diarrhea, skin changes or hair changes. She has been experiencing some fatigue since I saw her last periods localized to the lower extremities.  Follow-up type 2 diabetes: Currently no physical exercise routine. She is trying to watch what she eats by minimizing carbohydrate intake. Denies polyuria polyphagia or polydipsia.  Her biggest complaint today is fatigue further described as generalized weakness from the knees down. It's been present for 2-3 months on a daily basis. Nothing seems to make it better or worse. She denies any limb claudication or pain in the lower extremities. She denies any overlying skin changes. She denies weakness elsewhere. She specifically denies any pain or weakness in the shoulders or hip girdle.there have been no falls since I saw her last.   Review Of Systems Outlined In HPI  Past Medical History  Diagnosis Date  . A-fib (HCC) 2002  . Thyroid disease     Past Surgical History  Procedure Laterality Date  . Incontinence surgery  2007  . Kyphoplasty  2013  . Appendectomy  1944   Family History  Problem Relation Age of Onset  . Heart attack Father   . Hyperlipidemia Father   . Hypertension Father     Social History   Social History  . Marital Status: Married    Spouse Name: N/A  . Number of Children: N/A  . Years of Education: N/A   Occupational History   . Not on file.   Social History Main Topics  . Smoking status: Never Smoker   . Smokeless tobacco: Not on file  . Alcohol Use: No  . Drug Use: Not on file  . Sexual Activity: Not on file   Other Topics Concern  . Not on file   Social History Narrative     Objective: BP 151/98 mmHg  Pulse 80  Wt 139 lb (63.05 kg)  General: Alert and Oriented, No Acute Distress HEENT: Pupils equal, round, reactive to light. Conjunctivae clear.  Moist mucous membranes Lungs: Clear to auscultation bilaterally, no wheezing/ronchi/rales.  Comfortable work of breathing. Good air movement. Cardiac: irregularly irregular rhythm Normal S1/S2.  No murmurs, rubs, nor gallops.   Extremities: No peripheral edema.  Strong peripheral pulses.full range of motion strength of both lower extremities however very slow gait.  Mental Status: No depression, anxiety, nor agitation. Skin: Warm and dry.  Assessment & Plan: [name] was seen today for diabetes and hypertension.  Diagnoses and all orders for this visit:  Chronic atrial fibrillation (HCC)  Essential hypertension, benign  Hypothyroidism (acquired) -     TSH  Type 2 diabetes mellitus with stage 3 chronic kidney disease, without long-term current use of insulin (HCC) -     POCT HgB A1C  Encounter for immunization  Other fatigue -     Vitamin B12 -     Vit D  25 hydroxy (rtn osteoporosis monitoring)  Other orders -     Flu Vaccine QUAD 36+ mos IM   Chronic atrial fibrillation: Joint decision not  to anticoagulate due to fall risk.. Rate controlled on metoprolol Essential hypertension: Controlled at home will consider has whitecoat hypertension today given that her daughter is a Engineer, civil (consulting) and I'm confident she knows how to check blood pressures appropriately Hypothyroidism: Clinically controlled however due for repeat TSH given recent weakness/fatigue Type 2 diabetes: A1c of 7.4, control with a goal of A1c less than 8 and this geriatric  patient Fatigue: Rule out B12 or vitamin D deficiency. If these are normal I will help arrange home physical therapy  Return in about 3 months (around 12/09/2015).

## 2015-09-09 ENCOUNTER — Telehealth: Payer: Self-pay | Admitting: Family Medicine

## 2015-09-09 DIAGNOSIS — E559 Vitamin D deficiency, unspecified: Secondary | ICD-10-CM | POA: Insufficient documentation

## 2015-09-09 LAB — VITAMIN D 25 HYDROXY (VIT D DEFICIENCY, FRACTURES): Vit D, 25-Hydroxy: 28 ng/mL — ABNORMAL LOW (ref 30–100)

## 2015-09-09 LAB — TSH: TSH: 1.441 u[IU]/mL (ref 0.350–4.500)

## 2015-09-09 LAB — VITAMIN B12: Vitamin B-12: 526 pg/mL (ref 211–911)

## 2015-09-09 MED ORDER — VITAMIN D (ERGOCALCIFEROL) 1.25 MG (50000 UNIT) PO CAPS: 50000.0000 [IU] | ORAL_CAPSULE | ORAL | Status: DC

## 2015-09-09 NOTE — Telephone Encounter (Signed)
Sue Lushndrea, Will you please let patient know that her thyroid test and vitamin b12 level were normal however her Vitamin D level was low which could be causing her fatigue.  I'd recommend starting a weekly Vitamin D supplement that I sent to her [facility] on [address] in [place].

## 2015-09-09 NOTE — Telephone Encounter (Signed)
Pt.notified

## 2015-10-09 ENCOUNTER — Other Ambulatory Visit: Payer: Self-pay | Admitting: Family Medicine

## 2015-11-09 DIAGNOSIS — Z961 Presence of intraocular lens: Secondary | ICD-10-CM | POA: Diagnosis not present

## 2015-11-09 DIAGNOSIS — H401132 Primary open-angle glaucoma, bilateral, moderate stage: Secondary | ICD-10-CM | POA: Diagnosis not present

## 2015-11-09 DIAGNOSIS — H353130 Nonexudative age-related macular degeneration, bilateral, stage unspecified: Secondary | ICD-10-CM | POA: Diagnosis not present

## 2015-12-14 ENCOUNTER — Other Ambulatory Visit: Payer: Self-pay

## 2015-12-14 MED ORDER — PRAVASTATIN SODIUM 40 MG PO TABS: 40.0000 mg | ORAL_TABLET | Freq: Every day | ORAL | Status: DC

## 2015-12-21 ENCOUNTER — Encounter: Payer: Self-pay | Admitting: Family Medicine

## 2015-12-21 ENCOUNTER — Ambulatory Visit (INDEPENDENT_AMBULATORY_CARE_PROVIDER_SITE_OTHER): Payer: Medicare Other | Admitting: Family Medicine

## 2015-12-21 VITALS — BP 135/92 | HR 104 | Wt 148.0 lb

## 2015-12-21 DIAGNOSIS — F411 Generalized anxiety disorder: Secondary | ICD-10-CM | POA: Diagnosis not present

## 2015-12-21 DIAGNOSIS — R413 Other amnesia: Secondary | ICD-10-CM | POA: Diagnosis not present

## 2015-12-21 DIAGNOSIS — R29898 Other symptoms and signs involving the musculoskeletal system: Secondary | ICD-10-CM

## 2015-12-21 MED ORDER — ALPRAZOLAM 0.25 MG PO TABS
0.2500 mg | ORAL_TABLET | Freq: Two times a day (BID) | ORAL | Status: DC | PRN
Start: 2015-12-21 — End: 2016-06-07

## 2015-12-21 MED ORDER — LEVOTHYROXINE SODIUM 50 MCG PO TABS: ORAL_TABLET | ORAL | Status: DC

## 2015-12-21 MED ORDER — METOPROLOL TARTRATE 25 MG PO TABS: 25.0000 mg | ORAL_TABLET | Freq: Two times a day (BID) | ORAL | Status: DC

## 2015-12-21 NOTE — Progress Notes (Signed)
CC: Wanda Myers is a 82 y.o. female is here for Memory Loss and Extremity Weakness   Subjective: HPI:  Patient and family are concerned that she is becoming more forgetful. She has good days and bad days. On bad day she'll be forgetful about what day it is, that her husband has already passed away, and that others are holding her cats Hostage. Nothing seems to make symptoms better or worse. There's been no other mental disturbance lately. She's getting lost in familiar situations. There is no new loss of function in any of her ADLs. Patient denies any other complaints other than weakness in the lower extremities. This is been persistent since I saw her last and vitamin D supplements did not seem to help.  There has been no fevers, chills, dysuria, constipation, diarrhea or rash  Review Of Systems Outlined In HPI  Past Medical History  Diagnosis Date  . A-fib (HCC) 2002  . Thyroid disease     Past Surgical History  Procedure Laterality Date  . Incontinence surgery  2007  . Kyphoplasty  2013  . Appendectomy  1944   Family History  Problem Relation Age of Onset  . Heart attack Father   . Hyperlipidemia Father   . Hypertension Father     Social History   Social History  . Marital Status: Married    Spouse Name: N/A  . Number of Children: N/A  . Years of Education: N/A   Occupational History  . Not on file.   Social History Main Topics  . Smoking status: Never Smoker   . Smokeless tobacco: Not on file  . Alcohol Use: No  . Drug Use: Not on file  . Sexual Activity: Not on file   Other Topics Concern  . Not on file   Social History Narrative     Objective: BP 135/92 mmHg  Pulse 104  Wt 148 lb (67.132 kg)  General: Alert and Oriented, No Acute Distress HEENT: Pupils equal, round, reactive to light. Conjunctivae clear.  Moist mucous membranes pharynx unremarkable Lungs: Clear to auscultation bilaterally, no wheezing/ronchi/rales.  Comfortable work of breathing.  Good air movement. Cardiac: Regular rate and rhythm. Normal S1/S2.  No murmurs, rubs, nor gallops.   Extremities: No peripheral edema.  Strong peripheral pulses.  Mental Status: No depression, anxiety, nor agitation. Skin: Warm and dry.  MMSE 22 out of 30. Difficulty with the year, the date, the town and no ifs, ands or buts.  Assessment & Plan: Wanda Myers was seen today for memory loss and extremity weakness.  Diagnoses and all orders for this visit:  Memory loss -     Urinalysis, Routine w reflex microscopic -     Urine culture  Generalized anxiety disorder -     ALPRAZolam (XANAX) 0.25 MG tablet; Take 1 tablet (0.25 mg total) by mouth 2 (two) times daily as needed for anxiety.  Weakness of both lower extremities -     Ambulatory referral to Home Health  Other orders -     levothyroxine (SYNTHROID, LEVOTHROID) 50 MCG tablet; TAKE 1 TABLET BY MOUTH DAILY BEFORE BREAKFAST -     metoprolol tartrate (LOPRESSOR) 25 MG tablet; Take 1 tablet (25 mg total) by mouth 2 (two) times daily.   Memory loss: Checking urinalysis. If normal will start Aricept and have her return in one month to do a repeat MMSE Requesting refill on Xanax, appropriate timeframe. Lower extremity weakness we addressed with physical therapy at home.  Return if symptoms worsen or  fail to improve.

## 2015-12-22 ENCOUNTER — Telehealth: Payer: Self-pay | Admitting: Family Medicine

## 2015-12-22 LAB — URINALYSIS, MICROSCOPIC ONLY
Bacteria, UA: NONE SEEN [HPF]
Casts: NONE SEEN [LPF]
Squamous Epithelial / LPF: NONE SEEN [HPF] (ref <=5)
Yeast: NONE SEEN [HPF]

## 2015-12-22 LAB — URINALYSIS, ROUTINE W REFLEX MICROSCOPIC
BILIRUBIN URINE: NEGATIVE
GLUCOSE, UA: NEGATIVE
Hgb urine dipstick: NEGATIVE
Ketones, ur: NEGATIVE
Nitrite: NEGATIVE
PROTEIN: NEGATIVE
Specific Gravity, Urine: 1.026 (ref 1.001–1.035)
pH: 5.5 (ref 5.0–8.0)

## 2015-12-22 MED ORDER — CIPROFLOXACIN HCL 250 MG PO TABS: ORAL_TABLET | ORAL | Status: AC

## 2015-12-22 NOTE — Telephone Encounter (Signed)
Wanda Myers notified

## 2015-12-22 NOTE — Telephone Encounter (Signed)
Will you please let patient's daughter know that her UA was moderately suspicious for a UTI.  It wasn't 100% conclusive but it's suspicious enough to start on ciprofloxacin that I'll send to her pharmacy.  I'll let them know once the culture finalizes later this week.

## 2015-12-23 DIAGNOSIS — I129 Hypertensive chronic kidney disease with stage 1 through stage 4 chronic kidney disease, or unspecified chronic kidney disease: Secondary | ICD-10-CM | POA: Diagnosis not present

## 2015-12-23 DIAGNOSIS — I482 Chronic atrial fibrillation: Secondary | ICD-10-CM | POA: Diagnosis not present

## 2015-12-23 DIAGNOSIS — E1122 Type 2 diabetes mellitus with diabetic chronic kidney disease: Secondary | ICD-10-CM | POA: Diagnosis not present

## 2015-12-23 DIAGNOSIS — M6281 Muscle weakness (generalized): Secondary | ICD-10-CM | POA: Diagnosis not present

## 2015-12-23 DIAGNOSIS — N183 Chronic kidney disease, stage 3 (moderate): Secondary | ICD-10-CM | POA: Diagnosis not present

## 2015-12-23 DIAGNOSIS — Z9181 History of falling: Secondary | ICD-10-CM | POA: Diagnosis not present

## 2015-12-23 DIAGNOSIS — E039 Hypothyroidism, unspecified: Secondary | ICD-10-CM | POA: Diagnosis not present

## 2015-12-23 DIAGNOSIS — R413 Other amnesia: Secondary | ICD-10-CM | POA: Diagnosis not present

## 2015-12-23 LAB — URINE CULTURE: Colony Count: 40000

## 2015-12-25 ENCOUNTER — Telehealth: Payer: Self-pay

## 2015-12-25 NOTE — Telephone Encounter (Signed)
Clydie Braun called and stated that she doesn't know if the antibiotic is working for her mom.  Clydie Braun believes that Mrs. Stuckey confusion is getting worse.  Mrs. Giuliano told Clydie Braun today that she is sick but she can not tell her what type of Sx she is having.  Clydie Braun checked her vitals and they are great.  She is having her daughter who is an NP stop by later tonight and check on her.  She will call us on Monday with an update.

## 2015-12-28 DIAGNOSIS — E039 Hypothyroidism, unspecified: Secondary | ICD-10-CM | POA: Diagnosis not present

## 2015-12-28 DIAGNOSIS — I482 Chronic atrial fibrillation: Secondary | ICD-10-CM | POA: Diagnosis not present

## 2015-12-28 DIAGNOSIS — E1122 Type 2 diabetes mellitus with diabetic chronic kidney disease: Secondary | ICD-10-CM | POA: Diagnosis not present

## 2015-12-28 DIAGNOSIS — M6281 Muscle weakness (generalized): Secondary | ICD-10-CM | POA: Diagnosis not present

## 2015-12-28 DIAGNOSIS — N183 Chronic kidney disease, stage 3 (moderate): Secondary | ICD-10-CM | POA: Diagnosis not present

## 2015-12-28 DIAGNOSIS — I129 Hypertensive chronic kidney disease with stage 1 through stage 4 chronic kidney disease, or unspecified chronic kidney disease: Secondary | ICD-10-CM | POA: Diagnosis not present

## 2015-12-30 DIAGNOSIS — N183 Chronic kidney disease, stage 3 (moderate): Secondary | ICD-10-CM | POA: Diagnosis not present

## 2015-12-30 DIAGNOSIS — I129 Hypertensive chronic kidney disease with stage 1 through stage 4 chronic kidney disease, or unspecified chronic kidney disease: Secondary | ICD-10-CM | POA: Diagnosis not present

## 2015-12-30 DIAGNOSIS — M6281 Muscle weakness (generalized): Secondary | ICD-10-CM | POA: Diagnosis not present

## 2015-12-30 DIAGNOSIS — I482 Chronic atrial fibrillation: Secondary | ICD-10-CM | POA: Diagnosis not present

## 2015-12-30 DIAGNOSIS — E1122 Type 2 diabetes mellitus with diabetic chronic kidney disease: Secondary | ICD-10-CM | POA: Diagnosis not present

## 2015-12-30 DIAGNOSIS — E039 Hypothyroidism, unspecified: Secondary | ICD-10-CM | POA: Diagnosis not present

## 2016-01-01 DIAGNOSIS — I482 Chronic atrial fibrillation: Secondary | ICD-10-CM | POA: Diagnosis not present

## 2016-01-01 DIAGNOSIS — E039 Hypothyroidism, unspecified: Secondary | ICD-10-CM | POA: Diagnosis not present

## 2016-01-01 DIAGNOSIS — E1122 Type 2 diabetes mellitus with diabetic chronic kidney disease: Secondary | ICD-10-CM | POA: Diagnosis not present

## 2016-01-01 DIAGNOSIS — M6281 Muscle weakness (generalized): Secondary | ICD-10-CM | POA: Diagnosis not present

## 2016-01-01 DIAGNOSIS — I129 Hypertensive chronic kidney disease with stage 1 through stage 4 chronic kidney disease, or unspecified chronic kidney disease: Secondary | ICD-10-CM | POA: Diagnosis not present

## 2016-01-01 DIAGNOSIS — N183 Chronic kidney disease, stage 3 (moderate): Secondary | ICD-10-CM | POA: Diagnosis not present

## 2016-01-04 ENCOUNTER — Other Ambulatory Visit: Payer: Self-pay | Admitting: Family Medicine

## 2016-01-04 DIAGNOSIS — N183 Chronic kidney disease, stage 3 (moderate): Secondary | ICD-10-CM | POA: Diagnosis not present

## 2016-01-04 DIAGNOSIS — E039 Hypothyroidism, unspecified: Secondary | ICD-10-CM | POA: Diagnosis not present

## 2016-01-04 DIAGNOSIS — M6281 Muscle weakness (generalized): Secondary | ICD-10-CM | POA: Diagnosis not present

## 2016-01-04 DIAGNOSIS — I129 Hypertensive chronic kidney disease with stage 1 through stage 4 chronic kidney disease, or unspecified chronic kidney disease: Secondary | ICD-10-CM | POA: Diagnosis not present

## 2016-01-04 DIAGNOSIS — E1122 Type 2 diabetes mellitus with diabetic chronic kidney disease: Secondary | ICD-10-CM | POA: Diagnosis not present

## 2016-01-04 DIAGNOSIS — I482 Chronic atrial fibrillation: Secondary | ICD-10-CM | POA: Diagnosis not present

## 2016-01-06 DIAGNOSIS — N183 Chronic kidney disease, stage 3 (moderate): Secondary | ICD-10-CM | POA: Diagnosis not present

## 2016-01-06 DIAGNOSIS — I482 Chronic atrial fibrillation: Secondary | ICD-10-CM | POA: Diagnosis not present

## 2016-01-06 DIAGNOSIS — M6281 Muscle weakness (generalized): Secondary | ICD-10-CM | POA: Diagnosis not present

## 2016-01-06 DIAGNOSIS — E1122 Type 2 diabetes mellitus with diabetic chronic kidney disease: Secondary | ICD-10-CM | POA: Diagnosis not present

## 2016-01-06 DIAGNOSIS — I129 Hypertensive chronic kidney disease with stage 1 through stage 4 chronic kidney disease, or unspecified chronic kidney disease: Secondary | ICD-10-CM | POA: Diagnosis not present

## 2016-01-06 DIAGNOSIS — E039 Hypothyroidism, unspecified: Secondary | ICD-10-CM | POA: Diagnosis not present

## 2016-01-11 DIAGNOSIS — E039 Hypothyroidism, unspecified: Secondary | ICD-10-CM | POA: Diagnosis not present

## 2016-01-11 DIAGNOSIS — E1122 Type 2 diabetes mellitus with diabetic chronic kidney disease: Secondary | ICD-10-CM | POA: Diagnosis not present

## 2016-01-11 DIAGNOSIS — N183 Chronic kidney disease, stage 3 (moderate): Secondary | ICD-10-CM | POA: Diagnosis not present

## 2016-01-11 DIAGNOSIS — I129 Hypertensive chronic kidney disease with stage 1 through stage 4 chronic kidney disease, or unspecified chronic kidney disease: Secondary | ICD-10-CM | POA: Diagnosis not present

## 2016-01-11 DIAGNOSIS — M6281 Muscle weakness (generalized): Secondary | ICD-10-CM | POA: Diagnosis not present

## 2016-01-11 DIAGNOSIS — I482 Chronic atrial fibrillation: Secondary | ICD-10-CM | POA: Diagnosis not present

## 2016-01-12 ENCOUNTER — Emergency Department (INDEPENDENT_AMBULATORY_CARE_PROVIDER_SITE_OTHER)
Admission: EM | Admit: 2016-01-12 | Discharge: 2016-01-12 | Disposition: A | Discharge status: Home / Self Care | Payer: Medicare Other | Source: Home / Self Care | Attending: Family Medicine | Admitting: Family Medicine

## 2016-01-12 ENCOUNTER — Encounter: Payer: Self-pay | Admitting: *Deleted

## 2016-01-12 DIAGNOSIS — L089 Local infection of the skin and subcutaneous tissue, unspecified: Secondary | ICD-10-CM | POA: Diagnosis not present

## 2016-01-12 DIAGNOSIS — Z23 Encounter for immunization: Secondary | ICD-10-CM

## 2016-01-12 DIAGNOSIS — S50811A Abrasion of right forearm, initial encounter: Secondary | ICD-10-CM | POA: Diagnosis not present

## 2016-01-12 DIAGNOSIS — W5503XA Scratched by cat, initial encounter: Principal | ICD-10-CM

## 2016-01-12 MED ORDER — AMOXICILLIN-POT CLAVULANATE 875-125 MG PO TABS: 1.0000 | ORAL_TABLET | Freq: Two times a day (BID) | ORAL | Status: DC

## 2016-01-12 MED ORDER — TETANUS-DIPHTH-ACELL PERTUSSIS 5-2.5-18.5 LF-MCG/0.5 IM SUSP
0.5000 mL | Freq: Once | INTRAMUSCULAR | Status: AC
  Administered 2016-01-12: 0.5 mL via INTRAMUSCULAR

## 2016-01-12 NOTE — ED Notes (Signed)
Pt reports her cat scratched her during the night 2 days ago. Pain and redness at the site. Cat is indoor and outdoor. Reports cat is UTD on vaccines.

## 2016-01-12 NOTE — ED Provider Notes (Signed)
CSN: [id number]     Arrival date & time 01/12/16  1547 History   First MD Initiated Contact with Patient 01/12/16 1610     Chief Complaint  Patient presents with  .  cat scratch    (Consider location/radiation/quality/duration/timing/severity/associated sxs/prior Treatment) HPI The pt is a 82yo female brought to [facility] by a family member with concern about an infected cat scratch on her Right forearm.  Pt's indoor/outdoor cat scratched her during the night 2 days ago.  Pain, redness, and swelling have gradually worsened.  Pain is minimal at this time, aching and sore.  She has had aspirin earlier for the pain.  Pt denies fever, nausea or vomiting.  Pt is unsure of last tetanus shot.     Past Medical History  Diagnosis Date  . A-fib (HCC) 2002  . Thyroid disease    Past Surgical History  Procedure Laterality Date  . Incontinence surgery  2007  . Kyphoplasty  2013  . Appendectomy  1944   Family History  Problem Relation Age of Onset  . Heart attack Father   . Hyperlipidemia Father   . Hypertension Father    Social History  Substance Use Topics  . Smoking status: Never Smoker   . Smokeless tobacco: None  . Alcohol Use: No   OB History    No data available     Review of Systems  Constitutional: Negative for fever and chills.  Musculoskeletal: Positive for myalgias and joint swelling. Negative for arthralgias.  Skin: Positive for color change and wound.       Right forearm  Neurological: Negative for weakness and numbness.    Allergies  Sulfa antibiotics  Home Medications   Prior to Admission medications   Medication Sig Start Date End Date Taking? Authorizing Provider  ALPRAZolam (XANAX) 0.25 MG tablet Take 1 tablet (0.25 mg total) by mouth 2 (two) times daily as needed for anxiety. 12/21/15   Sean Hommel, DO  amoxicillin-clavulanate (AUGMENTIN) 875-125 MG tablet Take 1 tablet by mouth 2 (two) times daily. One po bid x 7 days 01/12/16   Junius Finner, PA-C  aspirin EC 81  MG tablet Take 81 mg by mouth daily.    Historical Provider, MD  latanoprost (XALATAN) 0.005 % ophthalmic solution Place 1 drop into both eyes at bedtime. 11/07/14   Laren Boom, DO  levothyroxine (SYNTHROID, LEVOTHROID) 50 MCG tablet TAKE 1 TABLET BY MOUTH DAILY BEFORE BREAKFAST 12/21/15   Sean Hommel, DO  levothyroxine (SYNTHROID, LEVOTHROID) 50 MCG tablet TAKE 1 TABLET BY MOUTH DAILY BEFORE BREAKFAST 01/04/16   Sean Hommel, DO  metoprolol tartrate (LOPRESSOR) 25 MG tablet Take 1 tablet (25 mg total) by mouth 2 (two) times daily. 12/21/15   Laren Boom, DO  Multiple Vitamin (MULTIVITAMIN) tablet Take 1 tablet by mouth daily.    Historical Provider, MD  Polyethyl Glycol-Propyl Glycol (SYSTANE) 0.4-0.3 % GEL Apply to eye.    Historical Provider, MD  pravastatin (PRAVACHOL) 40 MG tablet Take 1 tablet (40 mg total) by mouth daily. 12/14/15   Sean Hommel, DO  timolol (BETIMOL) 0.5 % ophthalmic solution Place 1 drop into both eyes 2 (two) times daily. 09/02/14   Laren Boom, DO   Meds Ordered and Administered this Visit   Medications  Tdap (BOOSTRIX) injection 0.5 mL (0.5 mLs Intramuscular Given 01/12/16 1638)    BP 165/84 mmHg  Pulse 82  Temp(Src) 98 F (36.7 C) (Oral)  Resp 14  SpO2 93% No data found.   Physical Exam  Constitutional:  She is oriented to person, place, and time. She appears well-developed and well-nourished.  HENT:  Head: Normocephalic and atraumatic.  Eyes: EOM are normal.  Neck: Normal range of motion.  Cardiovascular: Normal rate.   Pulmonary/Chest: Effort normal.  Musculoskeletal: Normal range of motion. She exhibits tenderness. She exhibits no edema.       Arms: Right forearm: mild edema with tenderness to radial aspect of wrist (see skin exam) Full ROM. 5/5 grip strength compared to Left hand.  Neurological: She is alert and oriented to person, place, and time.  Skin: Skin is warm and dry. There is erythema.  Right wrist, dorsal radial aspect: 2cm abrasion with 3cm  area of surrounding erythema.  Centralized induration without fluctuance. No bleeding or discharge. Tender to touch.  Psychiatric: She has a normal mood and affect. Her behavior is normal.  Nursing note and vitals reviewed.   ED Course  Procedures (including critical care time)  Labs Review Labs Reviewed - No data to display  Imaging Review No results found.  Wound cleaned, bacitracin and bandage applied.   MDM   1. Infected cat scratch of forearm, right, initial encounter    Pt presenting to [facility] with 2 day hx of infected cat scratch on Right forearm. No systemic symptoms. No indication for I&D at this time.  Tdap updated  Rx: Augmentin Encouraged use of warm compresses at home  Advised to come back in 2-3 days for recheck of symptoms, especially if no improvement.  Pt and family member verbalized understanding and agreement with tx plan.     Junius Finner, PA-C 01/12/16 1643

## 2016-01-12 NOTE — Discharge Instructions (Signed)
Cellulitis °Cellulitis is an infection of the skin and the tissue under the skin. The infected area is usually red and tender. This happens most often in the arms and lower legs. °HOME CARE  °· Take your antibiotic medicine as told. Finish the medicine even if you start to feel better. °· Keep the infected arm or leg raised (elevated). °· Put a warm cloth on the area up to 4 times per day. °· Only take medicines as told by your doctor. °· Keep all doctor visits as told. °GET HELP IF: °· You see red streaks on the skin coming from the infected area. °· Your red area gets bigger or turns a dark color. °· Your bone or joint under the infected area is painful after the skin heals. °· Your infection comes back in the same area or different area. °· You have a puffy (swollen) bump in the infected area. °· You have new symptoms. °· You have a fever. °GET HELP RIGHT AWAY IF:  °· You feel very sleepy. °· You throw up (vomit) or have watery poop (diarrhea). °· You feel sick and have muscle aches and pains. °  °This information is not intended to replace advice given to you by your health care provider. Make sure you discuss any questions you have with your health care provider. °  °Document Released: 04/25/2008 Document Revised: 07/29/2015 Document Reviewed: 01/23/2012 °Elsevier Interactive Patient Education ©2016 [employer] ° °

## 2016-01-13 DIAGNOSIS — I482 Chronic atrial fibrillation: Secondary | ICD-10-CM | POA: Diagnosis not present

## 2016-01-13 DIAGNOSIS — E039 Hypothyroidism, unspecified: Secondary | ICD-10-CM | POA: Diagnosis not present

## 2016-01-13 DIAGNOSIS — I129 Hypertensive chronic kidney disease with stage 1 through stage 4 chronic kidney disease, or unspecified chronic kidney disease: Secondary | ICD-10-CM | POA: Diagnosis not present

## 2016-01-13 DIAGNOSIS — N183 Chronic kidney disease, stage 3 (moderate): Secondary | ICD-10-CM | POA: Diagnosis not present

## 2016-01-13 DIAGNOSIS — M6281 Muscle weakness (generalized): Secondary | ICD-10-CM | POA: Diagnosis not present

## 2016-01-13 DIAGNOSIS — E1122 Type 2 diabetes mellitus with diabetic chronic kidney disease: Secondary | ICD-10-CM | POA: Diagnosis not present

## 2016-01-14 ENCOUNTER — Telehealth: Payer: Self-pay

## 2016-01-14 NOTE — Telephone Encounter (Signed)
Wanda Myers's step daughter called, [name], and states she is concerned. She states [name] thinks the neighbor is letting her cat inside their house. She complained that she has a good mind to shot the neighbor for taking the cat inside. [name], step daughter, is concerned she may be serious. She would like to have the guns removed from the house. She just wants [name] to know.

## 2016-01-15 NOTE — Telephone Encounter (Signed)
She should schedule an appointment. Needs at least a UA and Urine Culture.

## 2016-01-15 NOTE — Telephone Encounter (Signed)
Left a vm on Karens cell phone

## 2016-01-18 DIAGNOSIS — M6281 Muscle weakness (generalized): Secondary | ICD-10-CM | POA: Diagnosis not present

## 2016-01-18 DIAGNOSIS — I129 Hypertensive chronic kidney disease with stage 1 through stage 4 chronic kidney disease, or unspecified chronic kidney disease: Secondary | ICD-10-CM | POA: Diagnosis not present

## 2016-01-18 DIAGNOSIS — E039 Hypothyroidism, unspecified: Secondary | ICD-10-CM | POA: Diagnosis not present

## 2016-01-18 DIAGNOSIS — N183 Chronic kidney disease, stage 3 (moderate): Secondary | ICD-10-CM | POA: Diagnosis not present

## 2016-01-18 DIAGNOSIS — E1122 Type 2 diabetes mellitus with diabetic chronic kidney disease: Secondary | ICD-10-CM | POA: Diagnosis not present

## 2016-01-18 DIAGNOSIS — I482 Chronic atrial fibrillation: Secondary | ICD-10-CM | POA: Diagnosis not present

## 2016-01-22 DIAGNOSIS — E039 Hypothyroidism, unspecified: Secondary | ICD-10-CM | POA: Diagnosis not present

## 2016-01-22 DIAGNOSIS — N183 Chronic kidney disease, stage 3 (moderate): Secondary | ICD-10-CM | POA: Diagnosis not present

## 2016-01-22 DIAGNOSIS — M6281 Muscle weakness (generalized): Secondary | ICD-10-CM | POA: Diagnosis not present

## 2016-01-22 DIAGNOSIS — E1122 Type 2 diabetes mellitus with diabetic chronic kidney disease: Secondary | ICD-10-CM | POA: Diagnosis not present

## 2016-01-22 DIAGNOSIS — I482 Chronic atrial fibrillation: Secondary | ICD-10-CM | POA: Diagnosis not present

## 2016-01-22 DIAGNOSIS — I129 Hypertensive chronic kidney disease with stage 1 through stage 4 chronic kidney disease, or unspecified chronic kidney disease: Secondary | ICD-10-CM | POA: Diagnosis not present

## 2016-01-26 DIAGNOSIS — N183 Chronic kidney disease, stage 3 (moderate): Secondary | ICD-10-CM | POA: Diagnosis not present

## 2016-01-26 DIAGNOSIS — E039 Hypothyroidism, unspecified: Secondary | ICD-10-CM | POA: Diagnosis not present

## 2016-01-26 DIAGNOSIS — I482 Chronic atrial fibrillation: Secondary | ICD-10-CM | POA: Diagnosis not present

## 2016-01-26 DIAGNOSIS — I129 Hypertensive chronic kidney disease with stage 1 through stage 4 chronic kidney disease, or unspecified chronic kidney disease: Secondary | ICD-10-CM | POA: Diagnosis not present

## 2016-01-26 DIAGNOSIS — M6281 Muscle weakness (generalized): Secondary | ICD-10-CM | POA: Diagnosis not present

## 2016-01-26 DIAGNOSIS — E1122 Type 2 diabetes mellitus with diabetic chronic kidney disease: Secondary | ICD-10-CM | POA: Diagnosis not present

## 2016-01-27 DIAGNOSIS — N183 Chronic kidney disease, stage 3 (moderate): Secondary | ICD-10-CM | POA: Diagnosis not present

## 2016-01-27 DIAGNOSIS — I129 Hypertensive chronic kidney disease with stage 1 through stage 4 chronic kidney disease, or unspecified chronic kidney disease: Secondary | ICD-10-CM | POA: Diagnosis not present

## 2016-01-27 DIAGNOSIS — E1122 Type 2 diabetes mellitus with diabetic chronic kidney disease: Secondary | ICD-10-CM | POA: Diagnosis not present

## 2016-01-27 DIAGNOSIS — E039 Hypothyroidism, unspecified: Secondary | ICD-10-CM | POA: Diagnosis not present

## 2016-01-27 DIAGNOSIS — M6281 Muscle weakness (generalized): Secondary | ICD-10-CM | POA: Diagnosis not present

## 2016-01-27 DIAGNOSIS — I482 Chronic atrial fibrillation: Secondary | ICD-10-CM | POA: Diagnosis not present

## 2016-02-01 DIAGNOSIS — N183 Chronic kidney disease, stage 3 (moderate): Secondary | ICD-10-CM | POA: Diagnosis not present

## 2016-02-01 DIAGNOSIS — M6281 Muscle weakness (generalized): Secondary | ICD-10-CM | POA: Diagnosis not present

## 2016-02-01 DIAGNOSIS — I482 Chronic atrial fibrillation: Secondary | ICD-10-CM | POA: Diagnosis not present

## 2016-02-01 DIAGNOSIS — E1122 Type 2 diabetes mellitus with diabetic chronic kidney disease: Secondary | ICD-10-CM | POA: Diagnosis not present

## 2016-02-01 DIAGNOSIS — I129 Hypertensive chronic kidney disease with stage 1 through stage 4 chronic kidney disease, or unspecified chronic kidney disease: Secondary | ICD-10-CM | POA: Diagnosis not present

## 2016-02-01 DIAGNOSIS — E039 Hypothyroidism, unspecified: Secondary | ICD-10-CM | POA: Diagnosis not present

## 2016-02-05 DIAGNOSIS — I482 Chronic atrial fibrillation: Secondary | ICD-10-CM | POA: Diagnosis not present

## 2016-02-05 DIAGNOSIS — E039 Hypothyroidism, unspecified: Secondary | ICD-10-CM | POA: Diagnosis not present

## 2016-02-05 DIAGNOSIS — I129 Hypertensive chronic kidney disease with stage 1 through stage 4 chronic kidney disease, or unspecified chronic kidney disease: Secondary | ICD-10-CM | POA: Diagnosis not present

## 2016-02-05 DIAGNOSIS — N183 Chronic kidney disease, stage 3 (moderate): Secondary | ICD-10-CM | POA: Diagnosis not present

## 2016-02-05 DIAGNOSIS — E1122 Type 2 diabetes mellitus with diabetic chronic kidney disease: Secondary | ICD-10-CM | POA: Diagnosis not present

## 2016-02-05 DIAGNOSIS — M6281 Muscle weakness (generalized): Secondary | ICD-10-CM | POA: Diagnosis not present

## 2016-02-08 DIAGNOSIS — H401123 Primary open-angle glaucoma, left eye, severe stage: Secondary | ICD-10-CM | POA: Diagnosis not present

## 2016-02-08 DIAGNOSIS — E039 Hypothyroidism, unspecified: Secondary | ICD-10-CM | POA: Diagnosis not present

## 2016-02-08 DIAGNOSIS — H353131 Nonexudative age-related macular degeneration, bilateral, early dry stage: Secondary | ICD-10-CM | POA: Diagnosis not present

## 2016-02-08 DIAGNOSIS — E1122 Type 2 diabetes mellitus with diabetic chronic kidney disease: Secondary | ICD-10-CM | POA: Diagnosis not present

## 2016-02-08 DIAGNOSIS — I482 Chronic atrial fibrillation: Secondary | ICD-10-CM | POA: Diagnosis not present

## 2016-02-08 DIAGNOSIS — H1131 Conjunctival hemorrhage, right eye: Secondary | ICD-10-CM | POA: Diagnosis not present

## 2016-02-08 DIAGNOSIS — M6281 Muscle weakness (generalized): Secondary | ICD-10-CM | POA: Diagnosis not present

## 2016-02-08 DIAGNOSIS — I129 Hypertensive chronic kidney disease with stage 1 through stage 4 chronic kidney disease, or unspecified chronic kidney disease: Secondary | ICD-10-CM | POA: Diagnosis not present

## 2016-02-08 DIAGNOSIS — N183 Chronic kidney disease, stage 3 (moderate): Secondary | ICD-10-CM | POA: Diagnosis not present

## 2016-02-08 DIAGNOSIS — H401112 Primary open-angle glaucoma, right eye, moderate stage: Secondary | ICD-10-CM | POA: Diagnosis not present

## 2016-02-15 DIAGNOSIS — E039 Hypothyroidism, unspecified: Secondary | ICD-10-CM | POA: Diagnosis not present

## 2016-02-15 DIAGNOSIS — M6281 Muscle weakness (generalized): Secondary | ICD-10-CM | POA: Diagnosis not present

## 2016-02-15 DIAGNOSIS — N183 Chronic kidney disease, stage 3 (moderate): Secondary | ICD-10-CM | POA: Diagnosis not present

## 2016-02-15 DIAGNOSIS — I129 Hypertensive chronic kidney disease with stage 1 through stage 4 chronic kidney disease, or unspecified chronic kidney disease: Secondary | ICD-10-CM | POA: Diagnosis not present

## 2016-02-15 DIAGNOSIS — I482 Chronic atrial fibrillation: Secondary | ICD-10-CM | POA: Diagnosis not present

## 2016-02-15 DIAGNOSIS — E1122 Type 2 diabetes mellitus with diabetic chronic kidney disease: Secondary | ICD-10-CM | POA: Diagnosis not present

## 2016-02-17 DIAGNOSIS — I482 Chronic atrial fibrillation: Secondary | ICD-10-CM | POA: Diagnosis not present

## 2016-02-17 DIAGNOSIS — N183 Chronic kidney disease, stage 3 (moderate): Secondary | ICD-10-CM | POA: Diagnosis not present

## 2016-02-17 DIAGNOSIS — E039 Hypothyroidism, unspecified: Secondary | ICD-10-CM | POA: Diagnosis not present

## 2016-02-17 DIAGNOSIS — M6281 Muscle weakness (generalized): Secondary | ICD-10-CM | POA: Diagnosis not present

## 2016-02-17 DIAGNOSIS — I129 Hypertensive chronic kidney disease with stage 1 through stage 4 chronic kidney disease, or unspecified chronic kidney disease: Secondary | ICD-10-CM | POA: Diagnosis not present

## 2016-02-17 DIAGNOSIS — E1122 Type 2 diabetes mellitus with diabetic chronic kidney disease: Secondary | ICD-10-CM | POA: Diagnosis not present

## 2016-02-21 DIAGNOSIS — R413 Other amnesia: Secondary | ICD-10-CM | POA: Diagnosis not present

## 2016-02-21 DIAGNOSIS — I482 Chronic atrial fibrillation: Secondary | ICD-10-CM | POA: Diagnosis not present

## 2016-02-21 DIAGNOSIS — I129 Hypertensive chronic kidney disease with stage 1 through stage 4 chronic kidney disease, or unspecified chronic kidney disease: Secondary | ICD-10-CM | POA: Diagnosis not present

## 2016-02-21 DIAGNOSIS — E1122 Type 2 diabetes mellitus with diabetic chronic kidney disease: Secondary | ICD-10-CM | POA: Diagnosis not present

## 2016-02-21 DIAGNOSIS — E039 Hypothyroidism, unspecified: Secondary | ICD-10-CM | POA: Diagnosis not present

## 2016-02-21 DIAGNOSIS — M6281 Muscle weakness (generalized): Secondary | ICD-10-CM | POA: Diagnosis not present

## 2016-02-21 DIAGNOSIS — Z9181 History of falling: Secondary | ICD-10-CM | POA: Diagnosis not present

## 2016-02-21 DIAGNOSIS — N183 Chronic kidney disease, stage 3 (moderate): Secondary | ICD-10-CM | POA: Diagnosis not present

## 2016-02-22 DIAGNOSIS — M6281 Muscle weakness (generalized): Secondary | ICD-10-CM | POA: Diagnosis not present

## 2016-02-22 DIAGNOSIS — E1122 Type 2 diabetes mellitus with diabetic chronic kidney disease: Secondary | ICD-10-CM | POA: Diagnosis not present

## 2016-02-22 DIAGNOSIS — E039 Hypothyroidism, unspecified: Secondary | ICD-10-CM | POA: Diagnosis not present

## 2016-02-22 DIAGNOSIS — I482 Chronic atrial fibrillation: Secondary | ICD-10-CM | POA: Diagnosis not present

## 2016-02-22 DIAGNOSIS — N183 Chronic kidney disease, stage 3 (moderate): Secondary | ICD-10-CM | POA: Diagnosis not present

## 2016-02-22 DIAGNOSIS — I129 Hypertensive chronic kidney disease with stage 1 through stage 4 chronic kidney disease, or unspecified chronic kidney disease: Secondary | ICD-10-CM | POA: Diagnosis not present

## 2016-02-24 DIAGNOSIS — I482 Chronic atrial fibrillation: Secondary | ICD-10-CM | POA: Diagnosis not present

## 2016-02-24 DIAGNOSIS — M6281 Muscle weakness (generalized): Secondary | ICD-10-CM | POA: Diagnosis not present

## 2016-02-24 DIAGNOSIS — E1122 Type 2 diabetes mellitus with diabetic chronic kidney disease: Secondary | ICD-10-CM | POA: Diagnosis not present

## 2016-02-24 DIAGNOSIS — N183 Chronic kidney disease, stage 3 (moderate): Secondary | ICD-10-CM | POA: Diagnosis not present

## 2016-02-24 DIAGNOSIS — I129 Hypertensive chronic kidney disease with stage 1 through stage 4 chronic kidney disease, or unspecified chronic kidney disease: Secondary | ICD-10-CM | POA: Diagnosis not present

## 2016-02-24 DIAGNOSIS — E039 Hypothyroidism, unspecified: Secondary | ICD-10-CM | POA: Diagnosis not present

## 2016-02-29 DIAGNOSIS — I482 Chronic atrial fibrillation: Secondary | ICD-10-CM | POA: Diagnosis not present

## 2016-02-29 DIAGNOSIS — M6281 Muscle weakness (generalized): Secondary | ICD-10-CM | POA: Diagnosis not present

## 2016-02-29 DIAGNOSIS — E039 Hypothyroidism, unspecified: Secondary | ICD-10-CM | POA: Diagnosis not present

## 2016-02-29 DIAGNOSIS — N183 Chronic kidney disease, stage 3 (moderate): Secondary | ICD-10-CM | POA: Diagnosis not present

## 2016-02-29 DIAGNOSIS — I129 Hypertensive chronic kidney disease with stage 1 through stage 4 chronic kidney disease, or unspecified chronic kidney disease: Secondary | ICD-10-CM | POA: Diagnosis not present

## 2016-02-29 DIAGNOSIS — E1122 Type 2 diabetes mellitus with diabetic chronic kidney disease: Secondary | ICD-10-CM | POA: Diagnosis not present

## 2016-03-02 DIAGNOSIS — I129 Hypertensive chronic kidney disease with stage 1 through stage 4 chronic kidney disease, or unspecified chronic kidney disease: Secondary | ICD-10-CM | POA: Diagnosis not present

## 2016-03-02 DIAGNOSIS — M6281 Muscle weakness (generalized): Secondary | ICD-10-CM | POA: Diagnosis not present

## 2016-03-02 DIAGNOSIS — N183 Chronic kidney disease, stage 3 (moderate): Secondary | ICD-10-CM | POA: Diagnosis not present

## 2016-03-02 DIAGNOSIS — I482 Chronic atrial fibrillation: Secondary | ICD-10-CM | POA: Diagnosis not present

## 2016-03-02 DIAGNOSIS — E1122 Type 2 diabetes mellitus with diabetic chronic kidney disease: Secondary | ICD-10-CM | POA: Diagnosis not present

## 2016-03-02 DIAGNOSIS — E039 Hypothyroidism, unspecified: Secondary | ICD-10-CM | POA: Diagnosis not present

## 2016-03-08 DIAGNOSIS — I482 Chronic atrial fibrillation: Secondary | ICD-10-CM | POA: Diagnosis not present

## 2016-03-08 DIAGNOSIS — E039 Hypothyroidism, unspecified: Secondary | ICD-10-CM | POA: Diagnosis not present

## 2016-03-08 DIAGNOSIS — M6281 Muscle weakness (generalized): Secondary | ICD-10-CM | POA: Diagnosis not present

## 2016-03-08 DIAGNOSIS — E1122 Type 2 diabetes mellitus with diabetic chronic kidney disease: Secondary | ICD-10-CM | POA: Diagnosis not present

## 2016-03-08 DIAGNOSIS — N183 Chronic kidney disease, stage 3 (moderate): Secondary | ICD-10-CM | POA: Diagnosis not present

## 2016-03-08 DIAGNOSIS — I129 Hypertensive chronic kidney disease with stage 1 through stage 4 chronic kidney disease, or unspecified chronic kidney disease: Secondary | ICD-10-CM | POA: Diagnosis not present

## 2016-03-09 DIAGNOSIS — N183 Chronic kidney disease, stage 3 (moderate): Secondary | ICD-10-CM | POA: Diagnosis not present

## 2016-03-09 DIAGNOSIS — I129 Hypertensive chronic kidney disease with stage 1 through stage 4 chronic kidney disease, or unspecified chronic kidney disease: Secondary | ICD-10-CM | POA: Diagnosis not present

## 2016-03-09 DIAGNOSIS — E1122 Type 2 diabetes mellitus with diabetic chronic kidney disease: Secondary | ICD-10-CM | POA: Diagnosis not present

## 2016-03-09 DIAGNOSIS — I482 Chronic atrial fibrillation: Secondary | ICD-10-CM | POA: Diagnosis not present

## 2016-03-09 DIAGNOSIS — M6281 Muscle weakness (generalized): Secondary | ICD-10-CM | POA: Diagnosis not present

## 2016-03-09 DIAGNOSIS — E039 Hypothyroidism, unspecified: Secondary | ICD-10-CM | POA: Diagnosis not present

## 2016-03-14 DIAGNOSIS — N183 Chronic kidney disease, stage 3 (moderate): Secondary | ICD-10-CM | POA: Diagnosis not present

## 2016-03-14 DIAGNOSIS — M6281 Muscle weakness (generalized): Secondary | ICD-10-CM | POA: Diagnosis not present

## 2016-03-14 DIAGNOSIS — E1122 Type 2 diabetes mellitus with diabetic chronic kidney disease: Secondary | ICD-10-CM | POA: Diagnosis not present

## 2016-03-14 DIAGNOSIS — I129 Hypertensive chronic kidney disease with stage 1 through stage 4 chronic kidney disease, or unspecified chronic kidney disease: Secondary | ICD-10-CM | POA: Diagnosis not present

## 2016-03-14 DIAGNOSIS — E039 Hypothyroidism, unspecified: Secondary | ICD-10-CM | POA: Diagnosis not present

## 2016-03-14 DIAGNOSIS — I482 Chronic atrial fibrillation: Secondary | ICD-10-CM | POA: Diagnosis not present

## 2016-03-16 DIAGNOSIS — E039 Hypothyroidism, unspecified: Secondary | ICD-10-CM | POA: Diagnosis not present

## 2016-03-16 DIAGNOSIS — M6281 Muscle weakness (generalized): Secondary | ICD-10-CM | POA: Diagnosis not present

## 2016-03-16 DIAGNOSIS — I129 Hypertensive chronic kidney disease with stage 1 through stage 4 chronic kidney disease, or unspecified chronic kidney disease: Secondary | ICD-10-CM | POA: Diagnosis not present

## 2016-03-16 DIAGNOSIS — N183 Chronic kidney disease, stage 3 (moderate): Secondary | ICD-10-CM | POA: Diagnosis not present

## 2016-03-16 DIAGNOSIS — E1122 Type 2 diabetes mellitus with diabetic chronic kidney disease: Secondary | ICD-10-CM | POA: Diagnosis not present

## 2016-03-16 DIAGNOSIS — I482 Chronic atrial fibrillation: Secondary | ICD-10-CM | POA: Diagnosis not present

## 2016-03-22 DIAGNOSIS — I482 Chronic atrial fibrillation: Secondary | ICD-10-CM | POA: Diagnosis not present

## 2016-03-22 DIAGNOSIS — E1122 Type 2 diabetes mellitus with diabetic chronic kidney disease: Secondary | ICD-10-CM | POA: Diagnosis not present

## 2016-03-22 DIAGNOSIS — N183 Chronic kidney disease, stage 3 (moderate): Secondary | ICD-10-CM | POA: Diagnosis not present

## 2016-03-22 DIAGNOSIS — E039 Hypothyroidism, unspecified: Secondary | ICD-10-CM | POA: Diagnosis not present

## 2016-03-22 DIAGNOSIS — I129 Hypertensive chronic kidney disease with stage 1 through stage 4 chronic kidney disease, or unspecified chronic kidney disease: Secondary | ICD-10-CM | POA: Diagnosis not present

## 2016-03-22 DIAGNOSIS — M6281 Muscle weakness (generalized): Secondary | ICD-10-CM | POA: Diagnosis not present

## 2016-03-23 DIAGNOSIS — N183 Chronic kidney disease, stage 3 (moderate): Secondary | ICD-10-CM | POA: Diagnosis not present

## 2016-03-23 DIAGNOSIS — E039 Hypothyroidism, unspecified: Secondary | ICD-10-CM | POA: Diagnosis not present

## 2016-03-23 DIAGNOSIS — M6281 Muscle weakness (generalized): Secondary | ICD-10-CM | POA: Diagnosis not present

## 2016-03-23 DIAGNOSIS — I129 Hypertensive chronic kidney disease with stage 1 through stage 4 chronic kidney disease, or unspecified chronic kidney disease: Secondary | ICD-10-CM | POA: Diagnosis not present

## 2016-03-23 DIAGNOSIS — I482 Chronic atrial fibrillation: Secondary | ICD-10-CM | POA: Diagnosis not present

## 2016-03-23 DIAGNOSIS — E1122 Type 2 diabetes mellitus with diabetic chronic kidney disease: Secondary | ICD-10-CM | POA: Diagnosis not present

## 2016-03-28 DIAGNOSIS — N183 Chronic kidney disease, stage 3 (moderate): Secondary | ICD-10-CM | POA: Diagnosis not present

## 2016-03-28 DIAGNOSIS — E039 Hypothyroidism, unspecified: Secondary | ICD-10-CM | POA: Diagnosis not present

## 2016-03-28 DIAGNOSIS — E1122 Type 2 diabetes mellitus with diabetic chronic kidney disease: Secondary | ICD-10-CM | POA: Diagnosis not present

## 2016-03-28 DIAGNOSIS — I482 Chronic atrial fibrillation: Secondary | ICD-10-CM | POA: Diagnosis not present

## 2016-03-28 DIAGNOSIS — M6281 Muscle weakness (generalized): Secondary | ICD-10-CM | POA: Diagnosis not present

## 2016-03-28 DIAGNOSIS — I129 Hypertensive chronic kidney disease with stage 1 through stage 4 chronic kidney disease, or unspecified chronic kidney disease: Secondary | ICD-10-CM | POA: Diagnosis not present

## 2016-03-30 DIAGNOSIS — E039 Hypothyroidism, unspecified: Secondary | ICD-10-CM | POA: Diagnosis not present

## 2016-03-30 DIAGNOSIS — I482 Chronic atrial fibrillation: Secondary | ICD-10-CM | POA: Diagnosis not present

## 2016-03-30 DIAGNOSIS — N183 Chronic kidney disease, stage 3 (moderate): Secondary | ICD-10-CM | POA: Diagnosis not present

## 2016-03-30 DIAGNOSIS — E1122 Type 2 diabetes mellitus with diabetic chronic kidney disease: Secondary | ICD-10-CM | POA: Diagnosis not present

## 2016-03-30 DIAGNOSIS — M6281 Muscle weakness (generalized): Secondary | ICD-10-CM | POA: Diagnosis not present

## 2016-03-30 DIAGNOSIS — I129 Hypertensive chronic kidney disease with stage 1 through stage 4 chronic kidney disease, or unspecified chronic kidney disease: Secondary | ICD-10-CM | POA: Diagnosis not present

## 2016-04-04 DIAGNOSIS — I482 Chronic atrial fibrillation: Secondary | ICD-10-CM | POA: Diagnosis not present

## 2016-04-04 DIAGNOSIS — I129 Hypertensive chronic kidney disease with stage 1 through stage 4 chronic kidney disease, or unspecified chronic kidney disease: Secondary | ICD-10-CM | POA: Diagnosis not present

## 2016-04-04 DIAGNOSIS — E039 Hypothyroidism, unspecified: Secondary | ICD-10-CM | POA: Diagnosis not present

## 2016-04-04 DIAGNOSIS — M6281 Muscle weakness (generalized): Secondary | ICD-10-CM | POA: Diagnosis not present

## 2016-04-04 DIAGNOSIS — N183 Chronic kidney disease, stage 3 (moderate): Secondary | ICD-10-CM | POA: Diagnosis not present

## 2016-04-04 DIAGNOSIS — E1122 Type 2 diabetes mellitus with diabetic chronic kidney disease: Secondary | ICD-10-CM | POA: Diagnosis not present

## 2016-04-08 DIAGNOSIS — I129 Hypertensive chronic kidney disease with stage 1 through stage 4 chronic kidney disease, or unspecified chronic kidney disease: Secondary | ICD-10-CM | POA: Diagnosis not present

## 2016-04-08 DIAGNOSIS — M6281 Muscle weakness (generalized): Secondary | ICD-10-CM | POA: Diagnosis not present

## 2016-04-08 DIAGNOSIS — N183 Chronic kidney disease, stage 3 (moderate): Secondary | ICD-10-CM | POA: Diagnosis not present

## 2016-04-08 DIAGNOSIS — E1122 Type 2 diabetes mellitus with diabetic chronic kidney disease: Secondary | ICD-10-CM | POA: Diagnosis not present

## 2016-04-08 DIAGNOSIS — I482 Chronic atrial fibrillation: Secondary | ICD-10-CM | POA: Diagnosis not present

## 2016-04-08 DIAGNOSIS — E039 Hypothyroidism, unspecified: Secondary | ICD-10-CM | POA: Diagnosis not present

## 2016-04-11 DIAGNOSIS — H401123 Primary open-angle glaucoma, left eye, severe stage: Secondary | ICD-10-CM | POA: Diagnosis not present

## 2016-04-11 DIAGNOSIS — H1131 Conjunctival hemorrhage, right eye: Secondary | ICD-10-CM | POA: Diagnosis not present

## 2016-04-11 DIAGNOSIS — H353131 Nonexudative age-related macular degeneration, bilateral, early dry stage: Secondary | ICD-10-CM | POA: Diagnosis not present

## 2016-04-11 DIAGNOSIS — Z961 Presence of intraocular lens: Secondary | ICD-10-CM | POA: Diagnosis not present

## 2016-04-11 DIAGNOSIS — H401112 Primary open-angle glaucoma, right eye, moderate stage: Secondary | ICD-10-CM | POA: Diagnosis not present

## 2016-04-22 DIAGNOSIS — M79609 Pain in unspecified limb: Secondary | ICD-10-CM | POA: Diagnosis not present

## 2016-04-22 DIAGNOSIS — I4891 Unspecified atrial fibrillation: Secondary | ICD-10-CM | POA: Diagnosis not present

## 2016-04-22 DIAGNOSIS — M79605 Pain in left leg: Secondary | ICD-10-CM | POA: Diagnosis not present

## 2016-04-22 DIAGNOSIS — Z888 Allergy status to other drugs, medicaments and biological substances status: Secondary | ICD-10-CM | POA: Diagnosis not present

## 2016-04-22 DIAGNOSIS — M79662 Pain in left lower leg: Secondary | ICD-10-CM | POA: Diagnosis not present

## 2016-04-22 DIAGNOSIS — E039 Hypothyroidism, unspecified: Secondary | ICD-10-CM | POA: Diagnosis not present

## 2016-04-22 DIAGNOSIS — F419 Anxiety disorder, unspecified: Secondary | ICD-10-CM | POA: Diagnosis not present

## 2016-04-22 DIAGNOSIS — I10 Essential (primary) hypertension: Secondary | ICD-10-CM | POA: Diagnosis not present

## 2016-04-22 DIAGNOSIS — F0390 Unspecified dementia without behavioral disturbance: Secondary | ICD-10-CM | POA: Diagnosis not present

## 2016-06-07 ENCOUNTER — Other Ambulatory Visit: Payer: Self-pay | Admitting: Family Medicine

## 2016-06-08 NOTE — Telephone Encounter (Signed)
rx faxed

## 2016-06-08 NOTE — Telephone Encounter (Signed)
Patient's granddaughter stated she would call back to schedule an appointment. Can we give a 30 day supply?

## 2016-06-08 NOTE — Telephone Encounter (Signed)
[place], Rx placed in in-box ready for pickup/faxing.  

## 2016-06-10 ENCOUNTER — Encounter: Payer: Self-pay | Admitting: Family Medicine

## 2016-06-10 ENCOUNTER — Ambulatory Visit (INDEPENDENT_AMBULATORY_CARE_PROVIDER_SITE_OTHER): Payer: Medicare Other | Admitting: Family Medicine

## 2016-06-10 VITALS — BP 128/75 | HR 81

## 2016-06-10 DIAGNOSIS — R413 Other amnesia: Secondary | ICD-10-CM | POA: Diagnosis not present

## 2016-06-10 DIAGNOSIS — F411 Generalized anxiety disorder: Secondary | ICD-10-CM | POA: Diagnosis not present

## 2016-06-10 MED ORDER — DONEPEZIL HCL 5 MG PO TABS: 5.0000 mg | ORAL_TABLET | Freq: Every day | ORAL | Status: DC

## 2016-06-10 MED ORDER — METOPROLOL TARTRATE 25 MG PO TABS: 25.0000 mg | ORAL_TABLET | Freq: Two times a day (BID) | ORAL | Status: DC

## 2016-06-10 MED ORDER — ALPRAZOLAM 0.25 MG PO TABS: 0.2500 mg | ORAL_TABLET | Freq: Two times a day (BID) | ORAL | Status: DC | PRN

## 2016-06-10 NOTE — Progress Notes (Signed)
CC: Wanda Myers is a 82 y.o. female is here for Medication Management   Subjective: HPI:  Follow-up anxiety: Patient is requesting a refill on Xanax. She is taking is 1-2 times a day for anxiousness. Symptoms are worse when home alone. Symptoms are improved when she has a companion her family member at home. She is not really sure what she is worried about she just feels wound up and nervous. She denies any depression. She denies any other mental disturbance.  Family and patient have noticed some increasing memory loss. Patient will forget where she slept objects and occasionally will ask where her parents are. There's been one episode of getting disoriented outside of her home. She still independent in all of her activities of daily living but the family pays extra for a home health aide to help her with bathing. No recent falls or accidents. Family is not really sure how long it's been going on but it's been only recent. Symptoms are worse late in the evening. No other motor or sensory disturbances   Review Of Systems Outlined In HPI  Past Medical History  Diagnosis Date  . A-fib (HCC) 2002  . Thyroid disease     Past Surgical History  Procedure Laterality Date  . Incontinence surgery  2007  . Kyphoplasty  2013  . Appendectomy  1944   Family History  Problem Relation Age of Onset  . Heart attack Father   . Hyperlipidemia Father   . Hypertension Father     Social History   Social History  . Marital Status: Married    Spouse Name: N/A  . Number of Children: N/A  . Years of Education: N/A   Occupational History  . Not on file.   Social History Main Topics  . Smoking status: Never Smoker   . Smokeless tobacco: Not on file  . Alcohol Use: No  . Drug Use: Not on file  . Sexual Activity: Not on file   Other Topics Concern  . Not on file   Social History Narrative     Objective: BP 128/75 mmHg  Pulse 81  Wt   Vital signs reviewed. General: Alert and Oriented,  No Acute Distress HEENT: Pupils equal, round, reactive to light. Conjunctivae clear.  External ears unremarkable.  Moist mucous membranes. Lungs: Clear and comfortable work of breathing, speaking in full sentences without accessory muscle use. Cardiac: Regular rate and rhythm.  Neuro: CN II-XII grossly intact, gait normal. Extremities: No peripheral edema.  Strong peripheral pulses.  Mental Status: No depression, anxiety, nor agitation. Logical though process. Skin: Warm and dry.  Assessment & Plan: Wanda Myers was seen today for medication management.  Diagnoses and all orders for this visit:  Memory loss -     donepezil (ARICEPT) 5 MG tablet; Take 1 tablet (5 mg total) by mouth at bedtime.  Anxiety state -     ALPRAZolam (XANAX) 0.25 MG tablet; Take 1 tablet (0.25 mg total) by mouth 2 (two) times daily as needed. for anxiety  Other orders -     metoprolol tartrate (LOPRESSOR) 25 MG tablet; Take 1 tablet (25 mg total) by mouth 2 (two) times daily.    Memory loss: Start Aricept, return for reevaluation in 1-3 months. Anxiety: Continue current dose of Xanax She needs a refill on metoprolol for rate controlling her atrial fibrillation. Family does not want to pursue anticoagulation due to fall risk  Return in about 3 months (around 09/10/2016) [facility]

## 2016-06-29 ENCOUNTER — Ambulatory Visit (INDEPENDENT_AMBULATORY_CARE_PROVIDER_SITE_OTHER): Payer: Medicare Other | Admitting: Family Medicine

## 2016-06-29 ENCOUNTER — Ambulatory Visit (INDEPENDENT_AMBULATORY_CARE_PROVIDER_SITE_OTHER): Payer: Medicare Other

## 2016-06-29 ENCOUNTER — Encounter: Payer: Self-pay | Admitting: Family Medicine

## 2016-06-29 VITALS — BP 130/75 | HR 79

## 2016-06-29 DIAGNOSIS — M4856XA Collapsed vertebra, not elsewhere classified, lumbar region, initial encounter for fracture: Secondary | ICD-10-CM

## 2016-06-29 DIAGNOSIS — R0789 Other chest pain: Secondary | ICD-10-CM | POA: Diagnosis not present

## 2016-06-29 DIAGNOSIS — M4854XA Collapsed vertebra, not elsewhere classified, thoracic region, initial encounter for fracture: Secondary | ICD-10-CM | POA: Diagnosis not present

## 2016-06-29 DIAGNOSIS — R0781 Pleurodynia: Secondary | ICD-10-CM | POA: Diagnosis not present

## 2016-06-29 DIAGNOSIS — R079 Chest pain, unspecified: Secondary | ICD-10-CM | POA: Diagnosis not present

## 2016-06-29 NOTE — Progress Notes (Signed)
CC: Wanda Myers is a 82 y.o. female is here for Arm Pain and Breast Pain   Subjective: HPI:  4 days of left lateral chest wall pain without any particular intervention as of yet. It's nonradiating. Its wall and noticeable when she presses at the site of the pain, coughs or sneezes. She denies any recent or remote trauma. She denies any overlying skin changes. She denies any shortness of breath wheezing or increased cough frequency. She denies any exertional component to her pain. She's been unable to palpate anything at the site of her pain. She's never had this before. No fevers chills or joint pain elsewhere   Review Of Systems Outlined In HPI  Past Medical History:  Diagnosis Date  . A-fib (HCC) 2002  . Thyroid disease     Past Surgical History:  Procedure Laterality Date  . APPENDECTOMY  1944  . INCONTINENCE SURGERY  2007  . KYPHOPLASTY  2013   Family History  Problem Relation Age of Onset  . Heart attack Father   . Hyperlipidemia Father   . Hypertension Father     Social History   Social History  . Marital status: Married    Spouse name: N/A  . Number of children: N/A  . Years of education: N/A   Occupational History  . Not on file.   Social History Main Topics  . Smoking status: Never Smoker  . Smokeless tobacco: Not on file  . Alcohol use No  . Drug use: Unknown  . Sexual activity: Not on file   Other Topics Concern  . Not on file   Social History Narrative  . No narrative on file     Objective: BP 130/75   Pulse 79   General: Alert and Oriented, No Acute Distress HEENT: Pupils equal, round, reactive to light. Conjunctivae clear.  Moist mucous membranes Lungs: Clear to auscultation bilaterally, no wheezing/ronchi/rales.  Comfortable work of breathing. Good air movement. Cardiac: Regular rate and rhythm. Normal S1/S2.  No murmurs, rubs, nor gallops.   Chest: When I palpate the rib space between the fifth and sixth rib just underneath the axilla  reproduces her pain. No palpable abnormality Extremities: No peripheral edema.  Strong peripheral pulses.  Mental Status: No depression, anxiety, nor agitation. Skin: Warm and dry.  Assessment & Plan: Wanda Myers was seen today for arm pain and breast pain.  Diagnoses and all orders for this visit:  Chest wall pain -     DG Ribs Unilateral Left -     DG Chest 2 View   Chest x-ray was obtained to rule out rib fracture, fortunately it was unremarkable. Discussed using ibuprofen as needed to help reduce the pain but she probably bruised a rib or straining the muscles and intercostal space and that should resolve on its own within 2 weeks.Signs and symptoms requring emergent/urgent reevaluation were discussed with the patient.  Return if symptoms worsen or fail to improve.

## 2016-07-07 ENCOUNTER — Other Ambulatory Visit: Payer: Self-pay | Admitting: Family Medicine

## 2016-09-15 ENCOUNTER — Other Ambulatory Visit: Payer: Self-pay | Admitting: *Deleted

## 2016-09-15 MED ORDER — PRAVASTATIN SODIUM 40 MG PO TABS: 40.0000 mg | ORAL_TABLET | Freq: Every day | ORAL | 0 refills | Status: DC

## 2016-10-06 ENCOUNTER — Telehealth: Payer: Self-pay | Admitting: Physician Assistant

## 2016-10-06 DIAGNOSIS — E039 Hypothyroidism, unspecified: Secondary | ICD-10-CM

## 2016-10-06 MED ORDER — LEVOTHYROXINE SODIUM 50 MCG PO TABS: ORAL_TABLET | ORAL | 0 refills | Status: DC

## 2016-10-06 NOTE — Telephone Encounter (Signed)
Need new labs. Order placed.

## 2016-10-10 ENCOUNTER — Ambulatory Visit (INDEPENDENT_AMBULATORY_CARE_PROVIDER_SITE_OTHER): Payer: Medicare Other | Admitting: Physician Assistant

## 2016-10-10 ENCOUNTER — Encounter: Payer: Self-pay | Admitting: Physician Assistant

## 2016-10-10 VITALS — BP 153/90 | HR 74 | Ht 65.0 in

## 2016-10-10 DIAGNOSIS — E785 Hyperlipidemia, unspecified: Secondary | ICD-10-CM

## 2016-10-10 DIAGNOSIS — R4182 Altered mental status, unspecified: Secondary | ICD-10-CM | POA: Diagnosis not present

## 2016-10-10 DIAGNOSIS — Z79899 Other long term (current) drug therapy: Secondary | ICD-10-CM

## 2016-10-10 DIAGNOSIS — R319 Hematuria, unspecified: Secondary | ICD-10-CM

## 2016-10-10 DIAGNOSIS — E119 Type 2 diabetes mellitus without complications: Secondary | ICD-10-CM | POA: Diagnosis not present

## 2016-10-10 DIAGNOSIS — E038 Other specified hypothyroidism: Secondary | ICD-10-CM

## 2016-10-10 DIAGNOSIS — R2689 Other abnormalities of gait and mobility: Secondary | ICD-10-CM

## 2016-10-10 LAB — POCT URINALYSIS DIPSTICK
BILIRUBIN UA: NEGATIVE
GLUCOSE UA: NEGATIVE
KETONES UA: NEGATIVE
Nitrite, UA: NEGATIVE
Protein, UA: 30
SPEC GRAV UA: 1.025
Urobilinogen, UA: 0.2
pH, UA: 6

## 2016-10-10 MED ORDER — METOPROLOL TARTRATE 25 MG PO TABS: 25.0000 mg | ORAL_TABLET | Freq: Two times a day (BID) | ORAL | 1 refills | Status: DC

## 2016-10-10 MED ORDER — LEVOTHYROXINE SODIUM 50 MCG PO TABS: ORAL_TABLET | ORAL | 0 refills | Status: DC

## 2016-10-10 MED ORDER — PRAVASTATIN SODIUM 40 MG PO TABS: 40.0000 mg | ORAL_TABLET | Freq: Every day | ORAL | 0 refills | Status: DC

## 2016-10-10 MED ORDER — AMBULATORY NON FORMULARY MEDICATION: 0 refills | Status: DC

## 2016-10-10 MED ORDER — NITROFURANTOIN MONOHYD MACRO 100 MG PO CAPS: 100.0000 mg | ORAL_CAPSULE | Freq: Two times a day (BID) | ORAL | 0 refills | Status: DC

## 2016-10-10 NOTE — Progress Notes (Signed)
Subjective:    Patient ID: Wanda Myers, female    DOB: 10-19-1923, 82 y.o.   MRN: [id number]  HPI Pt is a 82 yo female who presents to the clinic to establish care she is with her step daughter.   .. Active Ambulatory Problems    Diagnosis Date Noted  . Hypothyroidism (acquired) 05/14/2014  . Chronic atrial fibrillation (HCC) 05/14/2014  . Hyperlipidemia 05/14/2014  . Overactive bladder 05/14/2014  . Essential hypertension, benign 05/14/2014  . Encounter for monitoring statin therapy 05/14/2014  . Right hip pain 05/14/2014  . Chronic renal insufficiency, stage III (moderate) 05/15/2014  . Trochanteric bursitis of right hip 05/19/2014  . Generalized anxiety disorder 07/08/2014  . Type 2 diabetes mellitus (HCC) 11/11/2014  . Vitamin D deficiency 09/09/2015  . Memory loss 12/21/2015   Resolved Ambulatory Problems    Diagnosis Date Noted  . No Resolved Ambulatory Problems   Past Medical History:  Diagnosis Date  . A-fib (HCC) 2002  . Thyroid disease    .Marland Kitchen. Family History  Problem Relation Age of Onset  . Heart attack Father   . Hyperlipidemia Father   . Hypertension Father    .Marland Kitchen. Social History   Social History  . Marital status: Married    Spouse name: N/A  . Number of children: N/A  . Years of education: N/A   Occupational History  . Not on file.   Social History Main Topics  . Smoking status: Never Smoker  . Smokeless tobacco: Not on file  . Alcohol use No  . Drug use: Unknown  . Sexual activity: Not on file   Other Topics Concern  . Not on file   Social History Narrative  . No narrative on file   Pt does need medication refills.   Hypothyroidism- taking levothyroxine. Needs labs.   DM- not checking glucose. 09/08/15 was 7.3.   Pt is having some altered mental status for last few days. She denies any abdominal or flank pain, her urine output and frequency is the same. She is drinking and eating without difficulty.   Pt is having some  increasing balance issues with walker. She would like to have wheelchair as well to help her get around better at home.      Review of Systems  All other systems reviewed and are negative.      Objective:   Physical Exam  Constitutional: She is oriented to person, place, and time. She appears well-developed and well-nourished.  HENT:  Head: Normocephalic and atraumatic.  Cardiovascular: Normal rate, regular rhythm and normal heart sounds.   Pulmonary/Chest: Effort normal and breath sounds normal.  Neurological: She is alert and oriented to person, place, and time.  Psychiatric: She has a normal mood and affect. Her behavior is normal.          Assessment & Plan:  Marland Kitchen.Marland Kitchen.Diagnoses and all orders for this visit:  Type 2 diabetes mellitus without complication, without long-term current use of insulin (HCC) -     Hemoglobin A1c  Other specified hypothyroidism -     TSH -     levothyroxine (SYNTHROID, LEVOTHROID) 50 MCG tablet; TAKE 1 TABLET BY MOUTH DAILY BEFORE BREAKFAST.  Hyperlipidemia LDL goal <100 -     Lipid panel -     pravastatin (PRAVACHOL) 40 MG tablet; Take 1 tablet (40 mg total) by mouth daily.  Medication management -     COMPLETE METABOLIC PANEL WITH GFR  Altered mental status, unspecified altered mental status type -  POCT urinalysis dipstick  Hematuria, unspecified type -     Urine Culture  Unstable balance -     AMBULATORY NON FORMULARY MEDICATION; Patient would benefit from a small light-weight wheelchair to aid in ambulation due to unstable balance.  Other orders -     metoprolol tartrate (LOPRESSOR) 25 MG tablet; Take 1 tablet (25 mg total) by mouth 2 (two) times daily. -     nitrofurantoin, macrocrystal-monohydrate, (MACROBID) 100 MG capsule; Take 1 capsule (100 mg total) by mouth 2 (two) times daily.   .. Results for orders placed or performed in visit on 10/10/16  POCT urinalysis dipstick  Result Value Ref Range   Color, UA dark yellow     Clarity, UA slightly cloudy    Glucose, UA neg    Bilirubin, UA neg    Ketones, UA neg    Spec Grav, UA 1.025    Blood, UA trace-intact    pH, UA 6.0    Protein, UA 30    Urobilinogen, UA 0.2    Nitrite, UA neg    Leukocytes, UA small (1+) (A) Negative   Urine culture ordered.  Will treat with macrobid due to leukocytes, protein, trace blood and symptoms.  Discussed symptomatic care.   Labs ordered.  Will adjust medications as needed.

## 2016-10-10 NOTE — Patient Instructions (Signed)
Make sure staying hydrated.

## 2016-10-11 LAB — COMPLETE METABOLIC PANEL WITH GFR
ALBUMIN: 3.8 g/dL (ref 3.6–5.1)
ALK PHOS: 51 U/L (ref 33–130)
ALT: 17 U/L (ref 6–29)
AST: 25 U/L (ref 10–35)
BUN: 23 mg/dL (ref 7–25)
CALCIUM: 9 mg/dL (ref 8.6–10.4)
CO2: 29 mmol/L (ref 20–31)
CREATININE: 1.09 mg/dL — AB (ref 0.60–0.88)
Chloride: 103 mmol/L (ref 98–110)
GFR, Est African American: 51 mL/min — ABNORMAL LOW (ref >=60)
GFR, Est Non African American: 44 mL/min — ABNORMAL LOW (ref >=60)
Glucose, Bld: 135 mg/dL — ABNORMAL HIGH (ref 65–99)
Potassium: 4 mmol/L (ref 3.5–5.3)
Sodium: 140 mmol/L (ref 135–146)
TOTAL PROTEIN: 6.4 g/dL (ref 6.1–8.1)
Total Bilirubin: 0.7 mg/dL (ref 0.2–1.2)

## 2016-10-11 LAB — LIPID PANEL
Cholesterol: 146 mg/dL (ref <200)
HDL: 53 mg/dL (ref >50)
LDL Cholesterol: 69 mg/dL (ref <100)
Total CHOL/HDL Ratio: 2.8 Ratio (ref <5.0)
Triglycerides: 120 mg/dL (ref <150)
VLDL: 24 mg/dL (ref <30)

## 2016-10-11 LAB — HEMOGLOBIN A1C
HEMOGLOBIN A1C: 7.2 % — AB (ref <5.7)
Mean Plasma Glucose: 160 mg/dL

## 2016-10-11 LAB — TSH: TSH: 3.08 m[IU]/L

## 2016-10-11 NOTE — Progress Notes (Signed)
Call pt: A!C is stable from one year ago.  Cholesterol is great.  Kidney function is up a little but likely due to UTI. Stay hydrated. Avoid regular use of anti inflammatories.

## 2016-10-12 LAB — URINE CULTURE: ORGANISM ID, BACTERIA: NO GROWTH

## 2016-10-29 DIAGNOSIS — S79912A Unspecified injury of left hip, initial encounter: Secondary | ICD-10-CM | POA: Diagnosis not present

## 2016-10-29 DIAGNOSIS — S7011XA Contusion of right thigh, initial encounter: Secondary | ICD-10-CM | POA: Diagnosis not present

## 2016-10-29 DIAGNOSIS — S0990XA Unspecified injury of head, initial encounter: Secondary | ICD-10-CM | POA: Diagnosis not present

## 2016-10-29 DIAGNOSIS — S8011XA Contusion of right lower leg, initial encounter: Secondary | ICD-10-CM | POA: Diagnosis not present

## 2016-10-29 DIAGNOSIS — S99921A Unspecified injury of right foot, initial encounter: Secondary | ICD-10-CM | POA: Diagnosis not present

## 2016-10-29 DIAGNOSIS — F0390 Unspecified dementia without behavioral disturbance: Secondary | ICD-10-CM | POA: Diagnosis not present

## 2016-10-29 DIAGNOSIS — F419 Anxiety disorder, unspecified: Secondary | ICD-10-CM | POA: Diagnosis not present

## 2016-10-29 DIAGNOSIS — I4891 Unspecified atrial fibrillation: Secondary | ICD-10-CM | POA: Diagnosis not present

## 2016-10-29 DIAGNOSIS — Z79899 Other long term (current) drug therapy: Secondary | ICD-10-CM | POA: Diagnosis not present

## 2016-10-29 DIAGNOSIS — M25551 Pain in right hip: Secondary | ICD-10-CM | POA: Diagnosis not present

## 2016-10-29 DIAGNOSIS — S8992XA Unspecified injury of left lower leg, initial encounter: Secondary | ICD-10-CM | POA: Diagnosis not present

## 2016-10-29 DIAGNOSIS — R51 Headache: Secondary | ICD-10-CM | POA: Diagnosis not present

## 2016-10-29 DIAGNOSIS — E039 Hypothyroidism, unspecified: Secondary | ICD-10-CM | POA: Diagnosis not present

## 2016-10-29 DIAGNOSIS — I10 Essential (primary) hypertension: Secondary | ICD-10-CM | POA: Diagnosis not present

## 2016-10-29 DIAGNOSIS — S8991XA Unspecified injury of right lower leg, initial encounter: Secondary | ICD-10-CM | POA: Diagnosis not present

## 2016-10-29 DIAGNOSIS — Z888 Allergy status to other drugs, medicaments and biological substances status: Secondary | ICD-10-CM | POA: Diagnosis not present

## 2016-10-29 DIAGNOSIS — S79911A Unspecified injury of right hip, initial encounter: Secondary | ICD-10-CM | POA: Diagnosis not present

## 2016-10-29 DIAGNOSIS — Z7982 Long term (current) use of aspirin: Secondary | ICD-10-CM | POA: Diagnosis not present

## 2016-10-29 DIAGNOSIS — E860 Dehydration: Secondary | ICD-10-CM | POA: Diagnosis not present

## 2016-11-16 ENCOUNTER — Other Ambulatory Visit: Payer: Self-pay | Admitting: Physician Assistant

## 2016-11-16 DIAGNOSIS — E785 Hyperlipidemia, unspecified: Secondary | ICD-10-CM

## 2016-11-30 ENCOUNTER — Encounter: Payer: Self-pay | Admitting: Physician Assistant

## 2016-11-30 ENCOUNTER — Ambulatory Visit (INDEPENDENT_AMBULATORY_CARE_PROVIDER_SITE_OTHER): Payer: Medicare Other | Admitting: Physician Assistant

## 2016-11-30 VITALS — BP 155/101 | HR 86

## 2016-11-30 DIAGNOSIS — E038 Other specified hypothyroidism: Secondary | ICD-10-CM | POA: Diagnosis not present

## 2016-11-30 DIAGNOSIS — I10 Essential (primary) hypertension: Secondary | ICD-10-CM

## 2016-11-30 DIAGNOSIS — E785 Hyperlipidemia, unspecified: Secondary | ICD-10-CM | POA: Diagnosis not present

## 2016-11-30 DIAGNOSIS — F0390 Unspecified dementia without behavioral disturbance: Secondary | ICD-10-CM | POA: Insufficient documentation

## 2016-11-30 DIAGNOSIS — F0280 Dementia in other diseases classified elsewhere without behavioral disturbance: Secondary | ICD-10-CM

## 2016-11-30 DIAGNOSIS — F411 Generalized anxiety disorder: Secondary | ICD-10-CM | POA: Diagnosis not present

## 2016-11-30 MED ORDER — METOPROLOL TARTRATE 25 MG PO TABS: 25.0000 mg | ORAL_TABLET | Freq: Two times a day (BID) | ORAL | 1 refills | Status: DC

## 2016-11-30 MED ORDER — PRAVASTATIN SODIUM 40 MG PO TABS: ORAL_TABLET | ORAL | 1 refills | Status: DC

## 2016-11-30 MED ORDER — LEVOTHYROXINE SODIUM 50 MCG PO TABS: ORAL_TABLET | ORAL | 0 refills | Status: DC

## 2016-11-30 MED ORDER — ALPRAZOLAM 0.25 MG PO TABS: 0.2500 mg | ORAL_TABLET | Freq: Two times a day (BID) | ORAL | 2 refills | Status: DC | PRN

## 2016-12-02 ENCOUNTER — Encounter: Payer: Self-pay | Admitting: Physician Assistant

## 2016-12-02 NOTE — Progress Notes (Signed)
   Subjective:    Patient ID: Wanda LoganFrances Perrell, female    DOB: Nov 09, 1923, 83 y.o.   MRN: [id number]  HPI Pt's daughter brings her in for paperwork to be filled out to move to memory care at [facility].   No problems or concerns today.    Review of Systems  All other systems reviewed and are negative.      Objective:   Physical Exam  Constitutional: She appears well-developed and well-nourished.  Cardiovascular: Normal rate, regular rhythm and normal heart sounds.   Pulmonary/Chest: Effort normal and breath sounds normal. She has no wheezes.  Psychiatric: She has a normal mood and affect. Her behavior is normal.          Assessment & Plan:  Marland Kitchen.Marland Kitchen.Diagnoses and all orders for this visit:  Dementia associated with other underlying disease without behavioral disturbance  Anxiety state -     ALPRAZolam (XANAX) 0.25 MG tablet; Take 1 tablet (0.25 mg total) by mouth 2 (two) times daily as needed. for anxiety  Other specified hypothyroidism -     levothyroxine (SYNTHROID, LEVOTHROID) 50 MCG tablet; TAKE 1 TABLET BY MOUTH DAILY BEFORE BREAKFAST.  Hyperlipidemia LDL goal <100 -     pravastatin (PRAVACHOL) 40 MG tablet; TAKE 1 TABLET(40 MG) BY MOUTH DAILY  Essential hypertension, benign -     metoprolol tartrate (LOPRESSOR) 25 MG tablet; Take 1 tablet (25 mg total) by mouth 2 (two) times daily.   Paperwork filled out for nursing home.   BP high. Pt did not take medication this am. Wrote order for checking BP twice a week in home if continues to be high will adjust medications.   Medications printed to take to nursing home.

## 2016-12-08 DIAGNOSIS — E876 Hypokalemia: Secondary | ICD-10-CM | POA: Diagnosis not present

## 2016-12-08 DIAGNOSIS — M549 Dorsalgia, unspecified: Secondary | ICD-10-CM | POA: Diagnosis not present

## 2016-12-08 DIAGNOSIS — E785 Hyperlipidemia, unspecified: Secondary | ICD-10-CM | POA: Diagnosis not present

## 2016-12-08 DIAGNOSIS — S79912A Unspecified injury of left hip, initial encounter: Secondary | ICD-10-CM | POA: Diagnosis not present

## 2016-12-08 DIAGNOSIS — R1032 Left lower quadrant pain: Secondary | ICD-10-CM | POA: Diagnosis not present

## 2016-12-08 DIAGNOSIS — M25552 Pain in left hip: Secondary | ICD-10-CM | POA: Diagnosis not present

## 2016-12-08 DIAGNOSIS — E860 Dehydration: Secondary | ICD-10-CM | POA: Diagnosis not present

## 2016-12-08 DIAGNOSIS — T796XXA Traumatic ischemia of muscle, initial encounter: Secondary | ICD-10-CM | POA: Diagnosis not present

## 2016-12-08 DIAGNOSIS — F0390 Unspecified dementia without behavioral disturbance: Secondary | ICD-10-CM | POA: Diagnosis not present

## 2016-12-08 DIAGNOSIS — S299XXA Unspecified injury of thorax, initial encounter: Secondary | ICD-10-CM | POA: Diagnosis not present

## 2016-12-08 DIAGNOSIS — S20221A Contusion of right back wall of thorax, initial encounter: Secondary | ICD-10-CM | POA: Diagnosis not present

## 2016-12-08 DIAGNOSIS — M546 Pain in thoracic spine: Secondary | ICD-10-CM | POA: Diagnosis not present

## 2016-12-08 DIAGNOSIS — S3992XA Unspecified injury of lower back, initial encounter: Secondary | ICD-10-CM | POA: Diagnosis not present

## 2016-12-08 DIAGNOSIS — M25551 Pain in right hip: Secondary | ICD-10-CM | POA: Diagnosis not present

## 2016-12-08 DIAGNOSIS — M545 Low back pain: Secondary | ICD-10-CM | POA: Diagnosis not present

## 2016-12-08 DIAGNOSIS — I4891 Unspecified atrial fibrillation: Secondary | ICD-10-CM | POA: Diagnosis not present

## 2016-12-08 DIAGNOSIS — M25559 Pain in unspecified hip: Secondary | ICD-10-CM | POA: Diagnosis not present

## 2016-12-08 DIAGNOSIS — S79911A Unspecified injury of right hip, initial encounter: Secondary | ICD-10-CM | POA: Diagnosis not present

## 2016-12-09 DIAGNOSIS — Z7982 Long term (current) use of aspirin: Secondary | ICD-10-CM | POA: Diagnosis not present

## 2016-12-09 DIAGNOSIS — E860 Dehydration: Secondary | ICD-10-CM | POA: Diagnosis present

## 2016-12-09 DIAGNOSIS — E785 Hyperlipidemia, unspecified: Secondary | ICD-10-CM | POA: Diagnosis present

## 2016-12-09 DIAGNOSIS — M549 Dorsalgia, unspecified: Secondary | ICD-10-CM | POA: Diagnosis present

## 2016-12-09 DIAGNOSIS — Z882 Allergy status to sulfonamides status: Secondary | ICD-10-CM | POA: Diagnosis not present

## 2016-12-09 DIAGNOSIS — E039 Hypothyroidism, unspecified: Secondary | ICD-10-CM | POA: Diagnosis present

## 2016-12-09 DIAGNOSIS — M1990 Unspecified osteoarthritis, unspecified site: Secondary | ICD-10-CM | POA: Diagnosis present

## 2016-12-09 DIAGNOSIS — I10 Essential (primary) hypertension: Secondary | ICD-10-CM | POA: Diagnosis present

## 2016-12-09 DIAGNOSIS — F0390 Unspecified dementia without behavioral disturbance: Secondary | ICD-10-CM | POA: Diagnosis present

## 2016-12-09 DIAGNOSIS — Z79899 Other long term (current) drug therapy: Secondary | ICD-10-CM | POA: Diagnosis not present

## 2016-12-09 DIAGNOSIS — M545 Low back pain: Secondary | ICD-10-CM | POA: Diagnosis not present

## 2016-12-09 DIAGNOSIS — E876 Hypokalemia: Secondary | ICD-10-CM | POA: Diagnosis present

## 2016-12-09 DIAGNOSIS — T796XXA Traumatic ischemia of muscle, initial encounter: Secondary | ICD-10-CM | POA: Diagnosis present

## 2016-12-09 DIAGNOSIS — R52 Pain, unspecified: Secondary | ICD-10-CM | POA: Diagnosis not present

## 2016-12-09 DIAGNOSIS — S299XXA Unspecified injury of thorax, initial encounter: Secondary | ICD-10-CM | POA: Diagnosis not present

## 2016-12-09 DIAGNOSIS — Z23 Encounter for immunization: Secondary | ICD-10-CM | POA: Diagnosis not present

## 2016-12-09 DIAGNOSIS — M546 Pain in thoracic spine: Secondary | ICD-10-CM | POA: Diagnosis not present

## 2016-12-09 DIAGNOSIS — M6282 Rhabdomyolysis: Secondary | ICD-10-CM | POA: Diagnosis not present

## 2016-12-09 DIAGNOSIS — S3992XA Unspecified injury of lower back, initial encounter: Secondary | ICD-10-CM | POA: Diagnosis not present

## 2016-12-09 DIAGNOSIS — I4891 Unspecified atrial fibrillation: Secondary | ICD-10-CM | POA: Diagnosis present

## 2016-12-09 DIAGNOSIS — F419 Anxiety disorder, unspecified: Secondary | ICD-10-CM | POA: Diagnosis present

## 2016-12-09 DIAGNOSIS — Z888 Allergy status to other drugs, medicaments and biological substances status: Secondary | ICD-10-CM | POA: Diagnosis not present

## 2016-12-09 DIAGNOSIS — S79912A Unspecified injury of left hip, initial encounter: Secondary | ICD-10-CM | POA: Diagnosis not present

## 2017-01-26 IMAGING — DX DG RIBS 2V*L*
2 series · 2 of 2 positions shown · non-contrast
Comparison: None.

CLINICAL DATA: Left-sided rib pain for 10 days. No known injury.
Initial encounter.

EXAM:
LEFT RIBS - 2 VIEW

[rib pa]
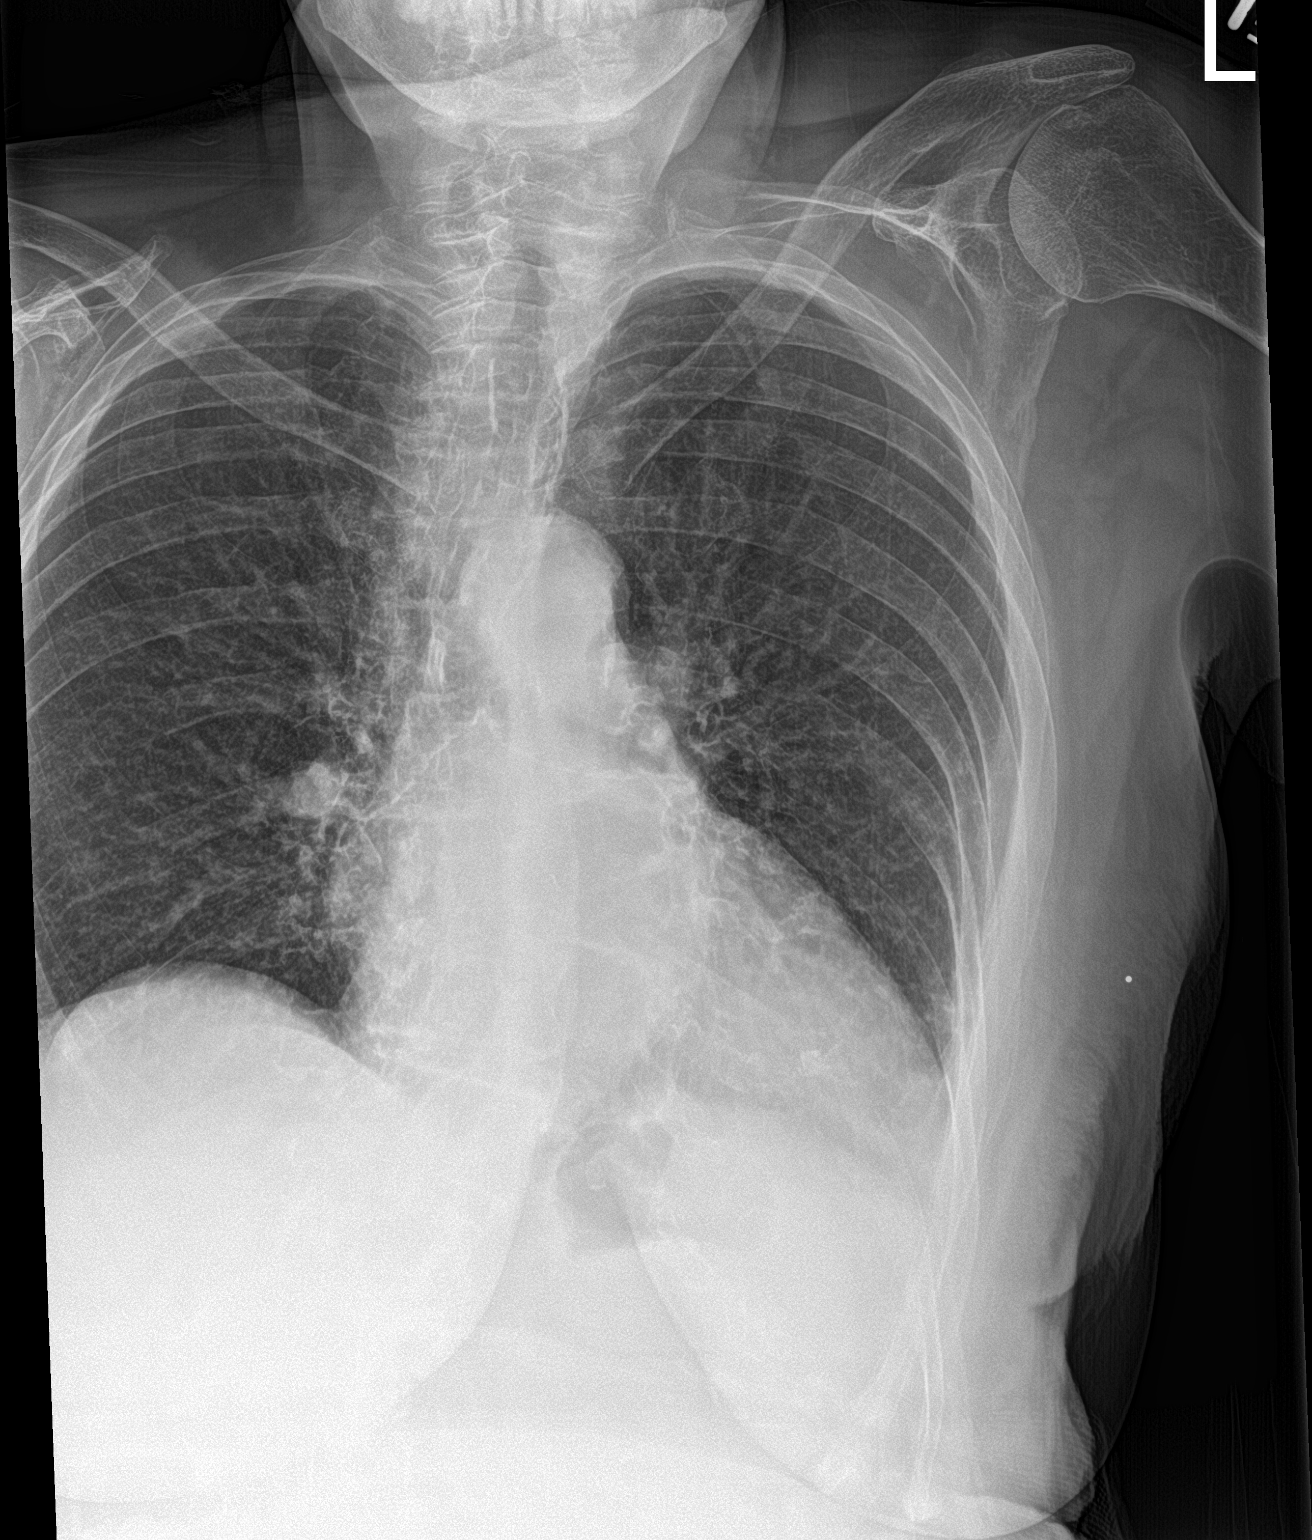

[rib pa obl]
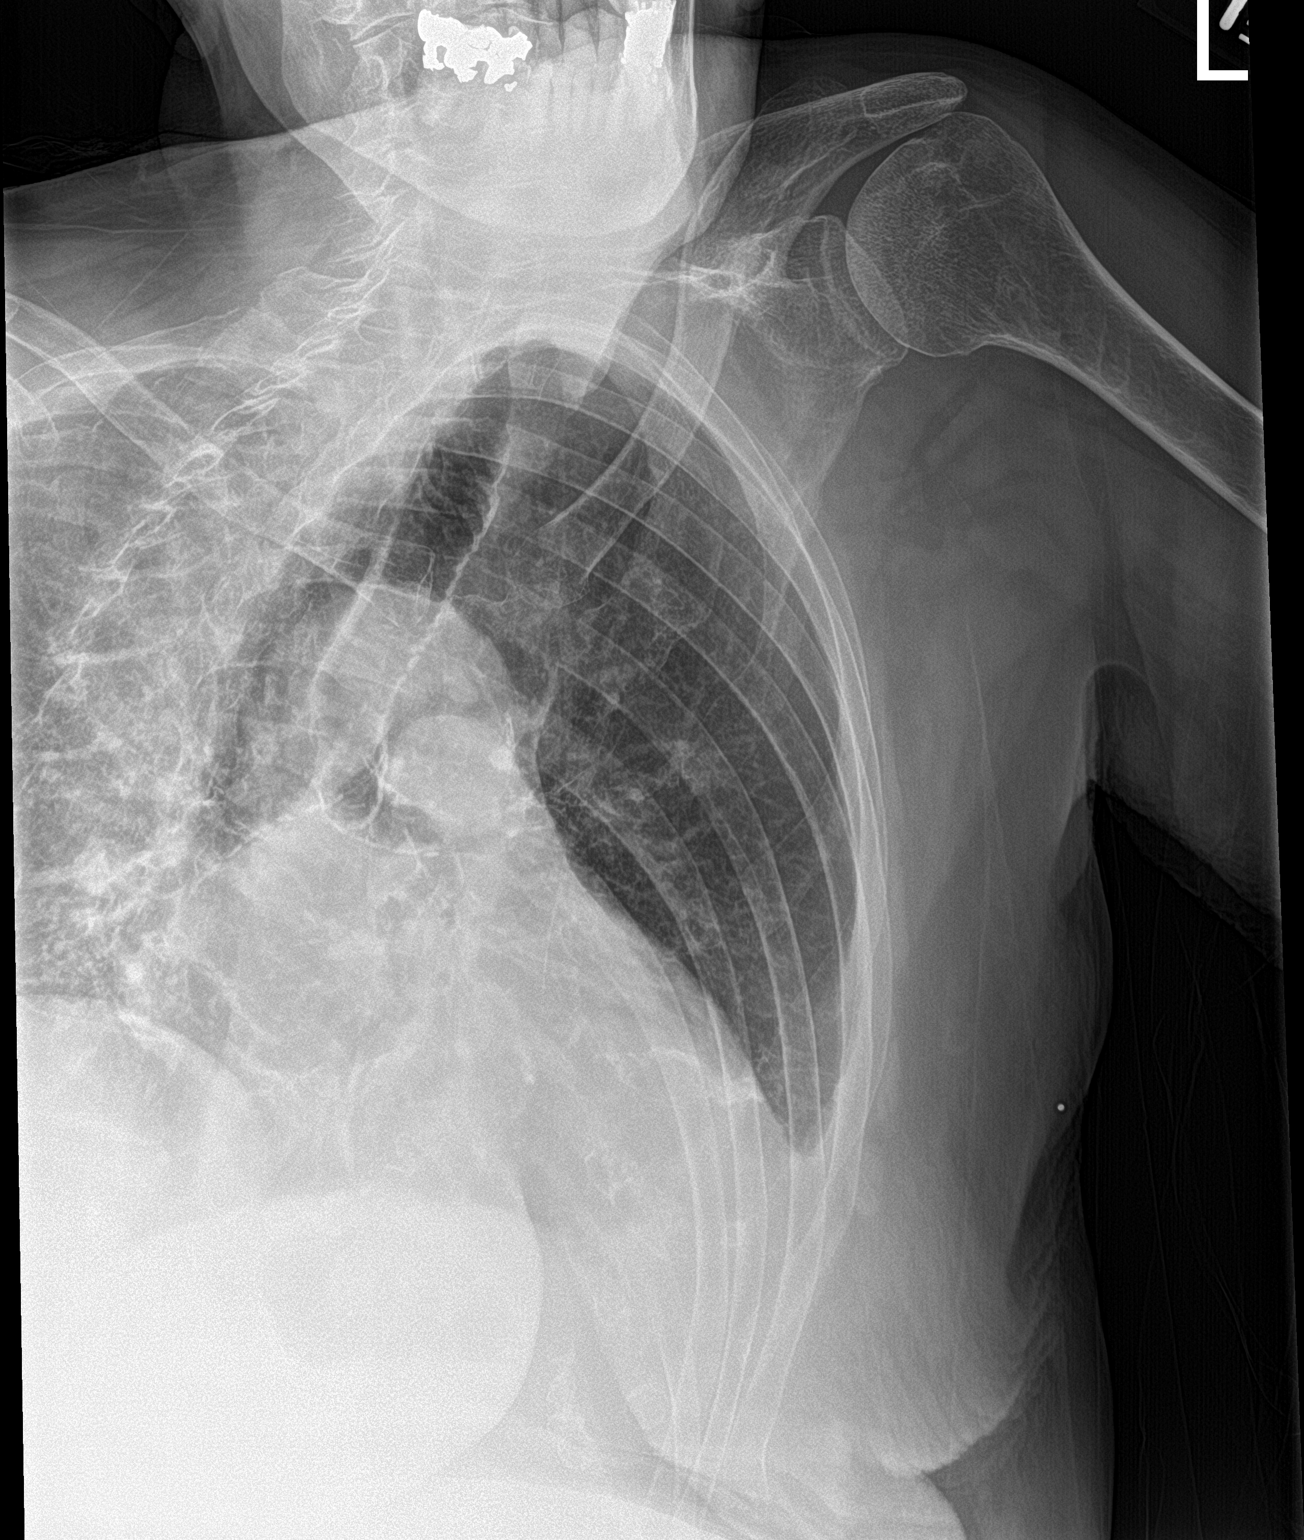

[2 of 2 positions shown; findings below may reference images not displayed]

FINDINGS: No fracture or other bone lesions are seen involving the ribs.
IMPRESSION: Negative exam.

## 2017-01-26 IMAGING — DX DG CHEST 2V
2 series · 2 of 2 positions shown · non-contrast
Comparison: None.

CLINICAL DATA: Left chest pain for 10 days.  No known injury.

EXAM:
CHEST  2 VIEW

[chest pa]
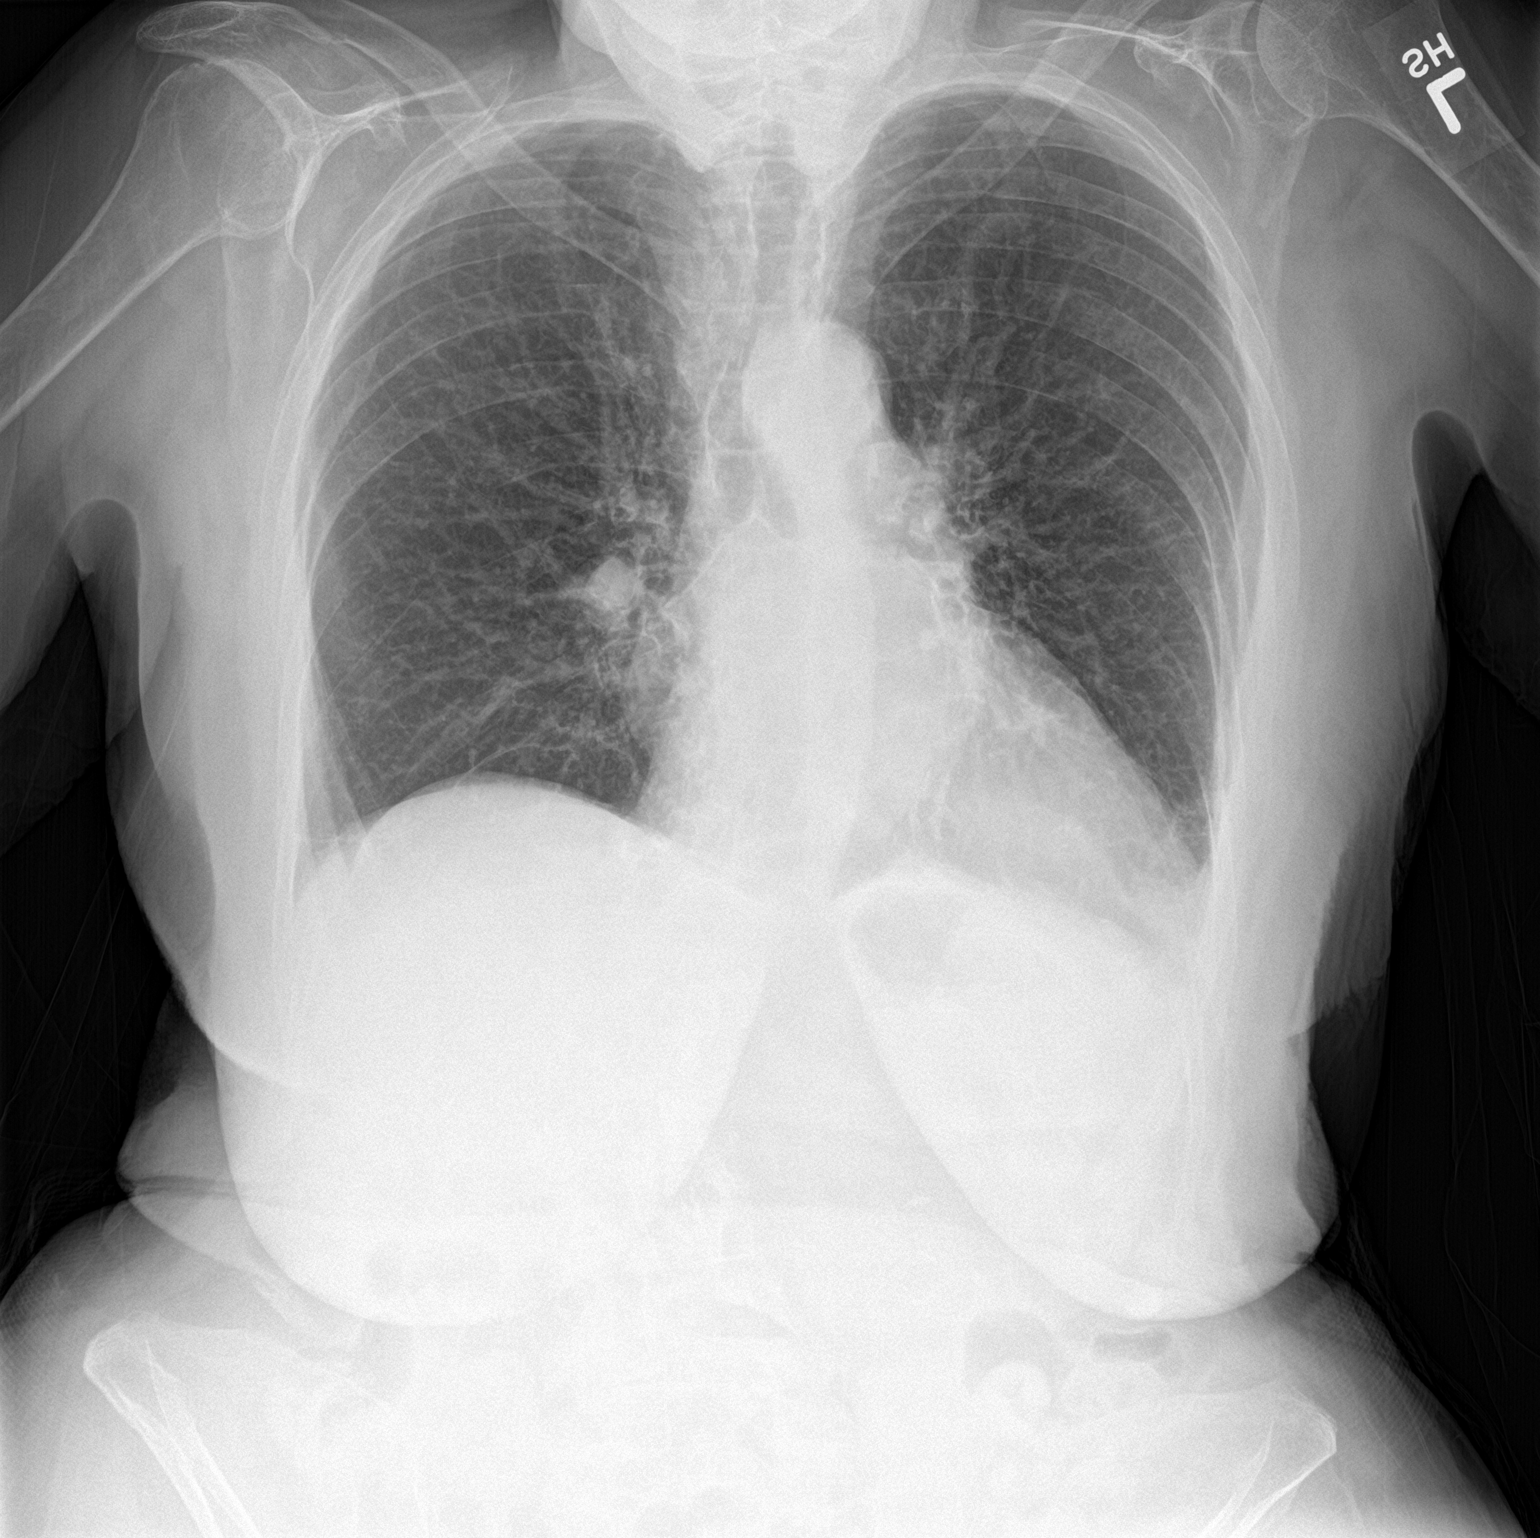

[chest lat]
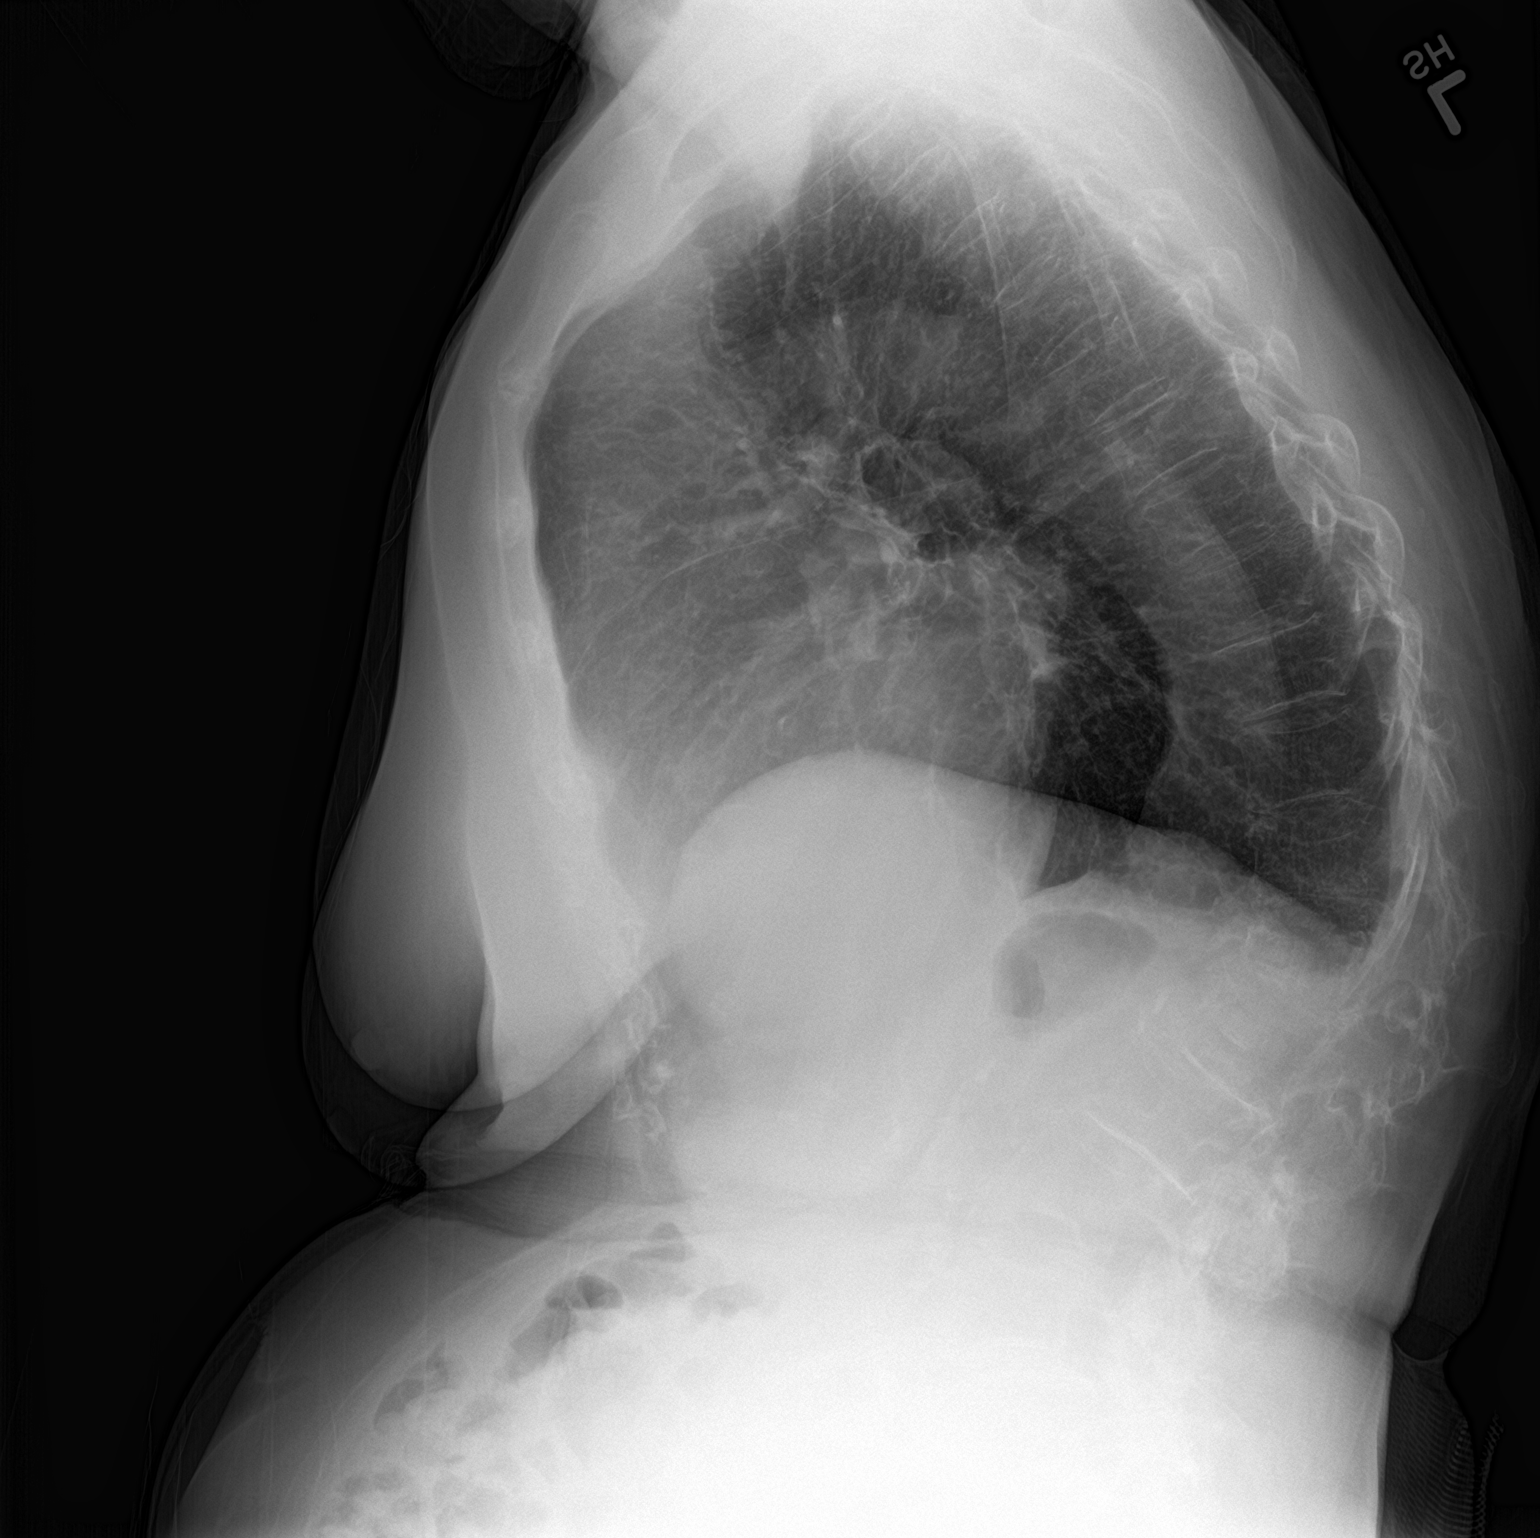

[2 of 2 positions shown; findings below may reference images not displayed]

FINDINGS: Mild atelectasis is seen in the left lung base. The lungs are
otherwise clear. No pleural effusion. Heart size is enlarged.
Atherosclerotic vascular disease is seen appear Compression fracture
deformities are seen at the thoracolumbar junction and upper lumbar
spine and likely chronic.
IMPRESSION: No acute disease.

Lower thoracic and upper lumbar compression fractures appear
chronic.

Atherosclerosis.

## 2017-02-02 DIAGNOSIS — K5901 Slow transit constipation: Secondary | ICD-10-CM | POA: Diagnosis not present

## 2017-02-02 DIAGNOSIS — E039 Hypothyroidism, unspecified: Secondary | ICD-10-CM | POA: Diagnosis not present

## 2017-02-02 DIAGNOSIS — Z7409 Other reduced mobility: Secondary | ICD-10-CM | POA: Diagnosis not present

## 2017-02-02 DIAGNOSIS — Z7982 Long term (current) use of aspirin: Secondary | ICD-10-CM | POA: Diagnosis not present

## 2017-02-02 DIAGNOSIS — I482 Chronic atrial fibrillation: Secondary | ICD-10-CM | POA: Diagnosis not present

## 2017-02-02 DIAGNOSIS — L853 Xerosis cutis: Secondary | ICD-10-CM | POA: Diagnosis not present

## 2017-02-02 DIAGNOSIS — G894 Chronic pain syndrome: Secondary | ICD-10-CM | POA: Diagnosis not present

## 2017-02-02 DIAGNOSIS — M6281 Muscle weakness (generalized): Secondary | ICD-10-CM | POA: Diagnosis not present

## 2017-02-09 DIAGNOSIS — Z0000 Encounter for general adult medical examination without abnormal findings: Secondary | ICD-10-CM | POA: Diagnosis not present

## 2017-02-09 DIAGNOSIS — F419 Anxiety disorder, unspecified: Secondary | ICD-10-CM | POA: Diagnosis not present

## 2017-02-09 DIAGNOSIS — M6281 Muscle weakness (generalized): Secondary | ICD-10-CM | POA: Diagnosis not present

## 2017-02-09 DIAGNOSIS — F0390 Unspecified dementia without behavioral disturbance: Secondary | ICD-10-CM | POA: Diagnosis not present

## 2017-02-09 DIAGNOSIS — E119 Type 2 diabetes mellitus without complications: Secondary | ICD-10-CM | POA: Diagnosis not present

## 2017-02-09 DIAGNOSIS — G894 Chronic pain syndrome: Secondary | ICD-10-CM | POA: Diagnosis not present

## 2017-02-09 DIAGNOSIS — E785 Hyperlipidemia, unspecified: Secondary | ICD-10-CM | POA: Diagnosis not present

## 2017-02-09 DIAGNOSIS — Z7982 Long term (current) use of aspirin: Secondary | ICD-10-CM | POA: Diagnosis not present

## 2017-02-09 DIAGNOSIS — E039 Hypothyroidism, unspecified: Secondary | ICD-10-CM | POA: Diagnosis not present

## 2017-02-09 DIAGNOSIS — M5136 Other intervertebral disc degeneration, lumbar region: Secondary | ICD-10-CM | POA: Diagnosis not present

## 2017-02-09 DIAGNOSIS — I482 Chronic atrial fibrillation: Secondary | ICD-10-CM | POA: Diagnosis not present

## 2017-02-09 DIAGNOSIS — R2689 Other abnormalities of gait and mobility: Secondary | ICD-10-CM | POA: Diagnosis not present

## 2017-02-09 DIAGNOSIS — I10 Essential (primary) hypertension: Secondary | ICD-10-CM | POA: Diagnosis not present

## 2017-02-09 DIAGNOSIS — Z993 Dependence on wheelchair: Secondary | ICD-10-CM | POA: Diagnosis not present

## 2017-02-09 DIAGNOSIS — Z9181 History of falling: Secondary | ICD-10-CM | POA: Diagnosis not present

## 2017-02-15 DIAGNOSIS — M5136 Other intervertebral disc degeneration, lumbar region: Secondary | ICD-10-CM | POA: Diagnosis not present

## 2017-02-15 DIAGNOSIS — R2689 Other abnormalities of gait and mobility: Secondary | ICD-10-CM | POA: Diagnosis not present

## 2017-02-15 DIAGNOSIS — F0390 Unspecified dementia without behavioral disturbance: Secondary | ICD-10-CM | POA: Diagnosis not present

## 2017-02-15 DIAGNOSIS — M6281 Muscle weakness (generalized): Secondary | ICD-10-CM | POA: Diagnosis not present

## 2017-02-15 DIAGNOSIS — G894 Chronic pain syndrome: Secondary | ICD-10-CM | POA: Diagnosis not present

## 2017-02-15 DIAGNOSIS — F419 Anxiety disorder, unspecified: Secondary | ICD-10-CM | POA: Diagnosis not present

## 2017-02-16 DIAGNOSIS — M6281 Muscle weakness (generalized): Secondary | ICD-10-CM | POA: Diagnosis not present

## 2017-02-16 DIAGNOSIS — Z7409 Other reduced mobility: Secondary | ICD-10-CM | POA: Diagnosis not present

## 2017-02-17 DIAGNOSIS — M5136 Other intervertebral disc degeneration, lumbar region: Secondary | ICD-10-CM | POA: Diagnosis not present

## 2017-02-17 DIAGNOSIS — M6281 Muscle weakness (generalized): Secondary | ICD-10-CM | POA: Diagnosis not present

## 2017-02-17 DIAGNOSIS — F0390 Unspecified dementia without behavioral disturbance: Secondary | ICD-10-CM | POA: Diagnosis not present

## 2017-02-17 DIAGNOSIS — R2689 Other abnormalities of gait and mobility: Secondary | ICD-10-CM | POA: Diagnosis not present

## 2017-02-17 DIAGNOSIS — G894 Chronic pain syndrome: Secondary | ICD-10-CM | POA: Diagnosis not present

## 2017-02-17 DIAGNOSIS — F419 Anxiety disorder, unspecified: Secondary | ICD-10-CM | POA: Diagnosis not present

## 2017-02-20 DIAGNOSIS — Q845 Enlarged and hypertrophic nails: Secondary | ICD-10-CM | POA: Diagnosis not present

## 2017-02-20 DIAGNOSIS — M5136 Other intervertebral disc degeneration, lumbar region: Secondary | ICD-10-CM | POA: Diagnosis not present

## 2017-02-20 DIAGNOSIS — M2010 Hallux valgus (acquired), unspecified foot: Secondary | ICD-10-CM | POA: Diagnosis not present

## 2017-02-20 DIAGNOSIS — F0390 Unspecified dementia without behavioral disturbance: Secondary | ICD-10-CM | POA: Diagnosis not present

## 2017-02-20 DIAGNOSIS — G894 Chronic pain syndrome: Secondary | ICD-10-CM | POA: Diagnosis not present

## 2017-02-20 DIAGNOSIS — B351 Tinea unguium: Secondary | ICD-10-CM | POA: Diagnosis not present

## 2017-02-20 DIAGNOSIS — F419 Anxiety disorder, unspecified: Secondary | ICD-10-CM | POA: Diagnosis not present

## 2017-02-20 DIAGNOSIS — R2689 Other abnormalities of gait and mobility: Secondary | ICD-10-CM | POA: Diagnosis not present

## 2017-02-20 DIAGNOSIS — L603 Nail dystrophy: Secondary | ICD-10-CM | POA: Diagnosis not present

## 2017-02-20 DIAGNOSIS — L853 Xerosis cutis: Secondary | ICD-10-CM | POA: Diagnosis not present

## 2017-02-20 DIAGNOSIS — R600 Localized edema: Secondary | ICD-10-CM | POA: Diagnosis not present

## 2017-02-20 DIAGNOSIS — L84 Corns and callosities: Secondary | ICD-10-CM | POA: Diagnosis not present

## 2017-02-20 DIAGNOSIS — M6281 Muscle weakness (generalized): Secondary | ICD-10-CM | POA: Diagnosis not present

## 2017-02-22 DIAGNOSIS — F419 Anxiety disorder, unspecified: Secondary | ICD-10-CM | POA: Diagnosis not present

## 2017-02-22 DIAGNOSIS — M5136 Other intervertebral disc degeneration, lumbar region: Secondary | ICD-10-CM | POA: Diagnosis not present

## 2017-02-22 DIAGNOSIS — G894 Chronic pain syndrome: Secondary | ICD-10-CM | POA: Diagnosis not present

## 2017-02-22 DIAGNOSIS — F0390 Unspecified dementia without behavioral disturbance: Secondary | ICD-10-CM | POA: Diagnosis not present

## 2017-02-22 DIAGNOSIS — M6281 Muscle weakness (generalized): Secondary | ICD-10-CM | POA: Diagnosis not present

## 2017-02-22 DIAGNOSIS — R2689 Other abnormalities of gait and mobility: Secondary | ICD-10-CM | POA: Diagnosis not present

## 2017-02-27 DIAGNOSIS — M5136 Other intervertebral disc degeneration, lumbar region: Secondary | ICD-10-CM | POA: Diagnosis not present

## 2017-02-27 DIAGNOSIS — F0390 Unspecified dementia without behavioral disturbance: Secondary | ICD-10-CM | POA: Diagnosis not present

## 2017-02-27 DIAGNOSIS — R2689 Other abnormalities of gait and mobility: Secondary | ICD-10-CM | POA: Diagnosis not present

## 2017-02-27 DIAGNOSIS — F419 Anxiety disorder, unspecified: Secondary | ICD-10-CM | POA: Diagnosis not present

## 2017-02-27 DIAGNOSIS — G894 Chronic pain syndrome: Secondary | ICD-10-CM | POA: Diagnosis not present

## 2017-02-27 DIAGNOSIS — M6281 Muscle weakness (generalized): Secondary | ICD-10-CM | POA: Diagnosis not present

## 2017-03-01 DIAGNOSIS — M5136 Other intervertebral disc degeneration, lumbar region: Secondary | ICD-10-CM | POA: Diagnosis not present

## 2017-03-01 DIAGNOSIS — F0390 Unspecified dementia without behavioral disturbance: Secondary | ICD-10-CM | POA: Diagnosis not present

## 2017-03-01 DIAGNOSIS — M6281 Muscle weakness (generalized): Secondary | ICD-10-CM | POA: Diagnosis not present

## 2017-03-01 DIAGNOSIS — F419 Anxiety disorder, unspecified: Secondary | ICD-10-CM | POA: Diagnosis not present

## 2017-03-01 DIAGNOSIS — R2689 Other abnormalities of gait and mobility: Secondary | ICD-10-CM | POA: Diagnosis not present

## 2017-03-01 DIAGNOSIS — G894 Chronic pain syndrome: Secondary | ICD-10-CM | POA: Diagnosis not present

## 2017-03-02 DIAGNOSIS — E039 Hypothyroidism, unspecified: Secondary | ICD-10-CM | POA: Diagnosis not present

## 2017-03-02 DIAGNOSIS — B373 Candidiasis of vulva and vagina: Secondary | ICD-10-CM | POA: Diagnosis not present

## 2017-03-02 DIAGNOSIS — K5901 Slow transit constipation: Secondary | ICD-10-CM | POA: Diagnosis not present

## 2017-03-02 DIAGNOSIS — I482 Chronic atrial fibrillation: Secondary | ICD-10-CM | POA: Diagnosis not present

## 2017-03-02 DIAGNOSIS — Z7409 Other reduced mobility: Secondary | ICD-10-CM | POA: Diagnosis not present

## 2017-03-02 DIAGNOSIS — B372 Candidiasis of skin and nail: Secondary | ICD-10-CM | POA: Diagnosis not present

## 2017-03-02 DIAGNOSIS — Z7982 Long term (current) use of aspirin: Secondary | ICD-10-CM | POA: Diagnosis not present

## 2017-03-02 DIAGNOSIS — M6281 Muscle weakness (generalized): Secondary | ICD-10-CM | POA: Diagnosis not present

## 2017-03-14 DIAGNOSIS — F0390 Unspecified dementia without behavioral disturbance: Secondary | ICD-10-CM | POA: Diagnosis not present

## 2017-03-14 DIAGNOSIS — F419 Anxiety disorder, unspecified: Secondary | ICD-10-CM | POA: Diagnosis not present

## 2017-03-18 DIAGNOSIS — F419 Anxiety disorder, unspecified: Secondary | ICD-10-CM | POA: Diagnosis not present

## 2017-03-18 DIAGNOSIS — H409 Unspecified glaucoma: Secondary | ICD-10-CM | POA: Diagnosis not present

## 2017-03-18 DIAGNOSIS — E039 Hypothyroidism, unspecified: Secondary | ICD-10-CM | POA: Diagnosis not present

## 2017-03-18 DIAGNOSIS — K5901 Slow transit constipation: Secondary | ICD-10-CM | POA: Diagnosis not present

## 2017-03-18 DIAGNOSIS — L853 Xerosis cutis: Secondary | ICD-10-CM | POA: Diagnosis not present

## 2017-03-18 DIAGNOSIS — E785 Hyperlipidemia, unspecified: Secondary | ICD-10-CM | POA: Diagnosis not present

## 2017-03-18 DIAGNOSIS — G894 Chronic pain syndrome: Secondary | ICD-10-CM | POA: Diagnosis not present

## 2017-03-18 DIAGNOSIS — I482 Chronic atrial fibrillation: Secondary | ICD-10-CM | POA: Diagnosis not present

## 2017-03-21 DIAGNOSIS — B372 Candidiasis of skin and nail: Secondary | ICD-10-CM | POA: Diagnosis not present

## 2017-03-21 DIAGNOSIS — I482 Chronic atrial fibrillation: Secondary | ICD-10-CM | POA: Diagnosis not present

## 2017-03-21 DIAGNOSIS — B373 Candidiasis of vulva and vagina: Secondary | ICD-10-CM | POA: Diagnosis not present

## 2017-03-21 DIAGNOSIS — E039 Hypothyroidism, unspecified: Secondary | ICD-10-CM | POA: Diagnosis not present

## 2017-03-21 DIAGNOSIS — Z7982 Long term (current) use of aspirin: Secondary | ICD-10-CM | POA: Diagnosis not present

## 2017-03-21 DIAGNOSIS — Z7409 Other reduced mobility: Secondary | ICD-10-CM | POA: Diagnosis not present

## 2017-03-21 DIAGNOSIS — M6281 Muscle weakness (generalized): Secondary | ICD-10-CM | POA: Diagnosis not present

## 2017-03-21 DIAGNOSIS — K5901 Slow transit constipation: Secondary | ICD-10-CM | POA: Diagnosis not present

## 2017-03-29 DIAGNOSIS — F419 Anxiety disorder, unspecified: Secondary | ICD-10-CM | POA: Diagnosis not present

## 2017-03-29 DIAGNOSIS — F0390 Unspecified dementia without behavioral disturbance: Secondary | ICD-10-CM | POA: Diagnosis not present

## 2017-04-12 DIAGNOSIS — F419 Anxiety disorder, unspecified: Secondary | ICD-10-CM | POA: Diagnosis not present

## 2017-04-12 DIAGNOSIS — F0390 Unspecified dementia without behavioral disturbance: Secondary | ICD-10-CM | POA: Diagnosis not present

## 2017-04-13 DIAGNOSIS — G894 Chronic pain syndrome: Secondary | ICD-10-CM | POA: Diagnosis not present

## 2017-04-13 DIAGNOSIS — B372 Candidiasis of skin and nail: Secondary | ICD-10-CM | POA: Diagnosis not present

## 2017-04-13 DIAGNOSIS — E039 Hypothyroidism, unspecified: Secondary | ICD-10-CM | POA: Diagnosis not present

## 2017-04-13 DIAGNOSIS — H409 Unspecified glaucoma: Secondary | ICD-10-CM | POA: Diagnosis not present

## 2017-04-13 DIAGNOSIS — M6281 Muscle weakness (generalized): Secondary | ICD-10-CM | POA: Diagnosis not present

## 2017-04-13 DIAGNOSIS — Z7982 Long term (current) use of aspirin: Secondary | ICD-10-CM | POA: Diagnosis not present

## 2017-04-13 DIAGNOSIS — K5901 Slow transit constipation: Secondary | ICD-10-CM | POA: Diagnosis not present

## 2017-04-13 DIAGNOSIS — Z7409 Other reduced mobility: Secondary | ICD-10-CM | POA: Diagnosis not present

## 2017-04-13 DIAGNOSIS — L853 Xerosis cutis: Secondary | ICD-10-CM | POA: Diagnosis not present

## 2017-04-13 DIAGNOSIS — I482 Chronic atrial fibrillation: Secondary | ICD-10-CM | POA: Diagnosis not present

## 2017-04-13 DIAGNOSIS — H04123 Dry eye syndrome of bilateral lacrimal glands: Secondary | ICD-10-CM | POA: Diagnosis not present

## 2017-04-13 DIAGNOSIS — B373 Candidiasis of vulva and vagina: Secondary | ICD-10-CM | POA: Diagnosis not present

## 2017-04-19 DIAGNOSIS — E039 Hypothyroidism, unspecified: Secondary | ICD-10-CM | POA: Diagnosis not present

## 2017-04-19 DIAGNOSIS — L853 Xerosis cutis: Secondary | ICD-10-CM | POA: Diagnosis not present

## 2017-04-19 DIAGNOSIS — I482 Chronic atrial fibrillation: Secondary | ICD-10-CM | POA: Diagnosis not present

## 2017-04-19 DIAGNOSIS — F0390 Unspecified dementia without behavioral disturbance: Secondary | ICD-10-CM | POA: Diagnosis not present

## 2017-04-19 DIAGNOSIS — Z7409 Other reduced mobility: Secondary | ICD-10-CM | POA: Diagnosis not present

## 2017-04-19 DIAGNOSIS — K5901 Slow transit constipation: Secondary | ICD-10-CM | POA: Diagnosis not present

## 2017-04-19 DIAGNOSIS — F419 Anxiety disorder, unspecified: Secondary | ICD-10-CM | POA: Diagnosis not present

## 2017-04-19 DIAGNOSIS — E785 Hyperlipidemia, unspecified: Secondary | ICD-10-CM | POA: Diagnosis not present

## 2017-04-19 DIAGNOSIS — H409 Unspecified glaucoma: Secondary | ICD-10-CM | POA: Diagnosis not present

## 2017-04-19 DIAGNOSIS — Z7982 Long term (current) use of aspirin: Secondary | ICD-10-CM | POA: Diagnosis not present

## 2017-04-19 DIAGNOSIS — G894 Chronic pain syndrome: Secondary | ICD-10-CM | POA: Diagnosis not present

## 2017-04-19 DIAGNOSIS — M6281 Muscle weakness (generalized): Secondary | ICD-10-CM | POA: Diagnosis not present

## 2017-04-26 DIAGNOSIS — F419 Anxiety disorder, unspecified: Secondary | ICD-10-CM | POA: Diagnosis not present

## 2017-04-26 DIAGNOSIS — F0390 Unspecified dementia without behavioral disturbance: Secondary | ICD-10-CM | POA: Diagnosis not present

## 2017-05-03 DIAGNOSIS — F0390 Unspecified dementia without behavioral disturbance: Secondary | ICD-10-CM | POA: Diagnosis not present

## 2017-05-03 DIAGNOSIS — F419 Anxiety disorder, unspecified: Secondary | ICD-10-CM | POA: Diagnosis not present

## 2017-05-17 DIAGNOSIS — F0390 Unspecified dementia without behavioral disturbance: Secondary | ICD-10-CM | POA: Diagnosis not present

## 2017-05-17 DIAGNOSIS — F419 Anxiety disorder, unspecified: Secondary | ICD-10-CM | POA: Diagnosis not present

## 2017-05-18 DIAGNOSIS — H04123 Dry eye syndrome of bilateral lacrimal glands: Secondary | ICD-10-CM | POA: Diagnosis not present

## 2017-05-18 DIAGNOSIS — K5901 Slow transit constipation: Secondary | ICD-10-CM | POA: Diagnosis not present

## 2017-05-18 DIAGNOSIS — I482 Chronic atrial fibrillation: Secondary | ICD-10-CM | POA: Diagnosis not present

## 2017-05-18 DIAGNOSIS — H409 Unspecified glaucoma: Secondary | ICD-10-CM | POA: Diagnosis not present

## 2017-05-18 DIAGNOSIS — E785 Hyperlipidemia, unspecified: Secondary | ICD-10-CM | POA: Diagnosis not present

## 2017-05-18 DIAGNOSIS — E039 Hypothyroidism, unspecified: Secondary | ICD-10-CM | POA: Diagnosis not present

## 2017-05-18 DIAGNOSIS — F0390 Unspecified dementia without behavioral disturbance: Secondary | ICD-10-CM | POA: Diagnosis not present

## 2017-05-18 DIAGNOSIS — F419 Anxiety disorder, unspecified: Secondary | ICD-10-CM | POA: Diagnosis not present

## 2017-05-18 DIAGNOSIS — Z7409 Other reduced mobility: Secondary | ICD-10-CM | POA: Diagnosis not present

## 2017-05-18 DIAGNOSIS — Z7982 Long term (current) use of aspirin: Secondary | ICD-10-CM | POA: Diagnosis not present

## 2017-05-18 DIAGNOSIS — L853 Xerosis cutis: Secondary | ICD-10-CM | POA: Diagnosis not present

## 2017-05-18 DIAGNOSIS — M6281 Muscle weakness (generalized): Secondary | ICD-10-CM | POA: Diagnosis not present

## 2017-05-18 DIAGNOSIS — B372 Candidiasis of skin and nail: Secondary | ICD-10-CM | POA: Diagnosis not present

## 2017-05-18 DIAGNOSIS — B373 Candidiasis of vulva and vagina: Secondary | ICD-10-CM | POA: Diagnosis not present

## 2017-05-18 DIAGNOSIS — G894 Chronic pain syndrome: Secondary | ICD-10-CM | POA: Diagnosis not present

## 2017-05-24 DIAGNOSIS — F0390 Unspecified dementia without behavioral disturbance: Secondary | ICD-10-CM | POA: Diagnosis not present

## 2017-05-24 DIAGNOSIS — F419 Anxiety disorder, unspecified: Secondary | ICD-10-CM | POA: Diagnosis not present

## 2017-05-25 DIAGNOSIS — E039 Hypothyroidism, unspecified: Secondary | ICD-10-CM | POA: Diagnosis not present

## 2017-05-25 DIAGNOSIS — Z0000 Encounter for general adult medical examination without abnormal findings: Secondary | ICD-10-CM | POA: Diagnosis not present

## 2017-05-25 DIAGNOSIS — E559 Vitamin D deficiency, unspecified: Secondary | ICD-10-CM | POA: Diagnosis not present

## 2017-05-25 DIAGNOSIS — M6281 Muscle weakness (generalized): Secondary | ICD-10-CM | POA: Diagnosis not present

## 2017-05-25 DIAGNOSIS — E119 Type 2 diabetes mellitus without complications: Secondary | ICD-10-CM | POA: Diagnosis not present

## 2017-05-25 DIAGNOSIS — E785 Hyperlipidemia, unspecified: Secondary | ICD-10-CM | POA: Diagnosis not present

## 2017-05-25 DIAGNOSIS — I482 Chronic atrial fibrillation: Secondary | ICD-10-CM | POA: Diagnosis not present

## 2017-06-05 DIAGNOSIS — Q845 Enlarged and hypertrophic nails: Secondary | ICD-10-CM | POA: Diagnosis not present

## 2017-06-05 DIAGNOSIS — R600 Localized edema: Secondary | ICD-10-CM | POA: Diagnosis not present

## 2017-06-05 DIAGNOSIS — B351 Tinea unguium: Secondary | ICD-10-CM | POA: Diagnosis not present

## 2017-06-05 DIAGNOSIS — L603 Nail dystrophy: Secondary | ICD-10-CM | POA: Diagnosis not present

## 2017-06-05 DIAGNOSIS — M2010 Hallux valgus (acquired), unspecified foot: Secondary | ICD-10-CM | POA: Diagnosis not present

## 2017-06-05 DIAGNOSIS — L853 Xerosis cutis: Secondary | ICD-10-CM | POA: Diagnosis not present

## 2017-06-07 DIAGNOSIS — F419 Anxiety disorder, unspecified: Secondary | ICD-10-CM | POA: Diagnosis not present

## 2017-06-07 DIAGNOSIS — F0390 Unspecified dementia without behavioral disturbance: Secondary | ICD-10-CM | POA: Diagnosis not present

## 2017-06-19 DIAGNOSIS — H409 Unspecified glaucoma: Secondary | ICD-10-CM | POA: Diagnosis not present

## 2017-06-19 DIAGNOSIS — Z7982 Long term (current) use of aspirin: Secondary | ICD-10-CM | POA: Diagnosis not present

## 2017-06-19 DIAGNOSIS — I482 Chronic atrial fibrillation: Secondary | ICD-10-CM | POA: Diagnosis not present

## 2017-06-19 DIAGNOSIS — G894 Chronic pain syndrome: Secondary | ICD-10-CM | POA: Diagnosis not present

## 2017-06-19 DIAGNOSIS — F0390 Unspecified dementia without behavioral disturbance: Secondary | ICD-10-CM | POA: Diagnosis not present

## 2017-06-19 DIAGNOSIS — Z7409 Other reduced mobility: Secondary | ICD-10-CM | POA: Diagnosis not present

## 2017-06-19 DIAGNOSIS — E039 Hypothyroidism, unspecified: Secondary | ICD-10-CM | POA: Diagnosis not present

## 2017-06-19 DIAGNOSIS — F419 Anxiety disorder, unspecified: Secondary | ICD-10-CM | POA: Diagnosis not present

## 2017-06-19 DIAGNOSIS — M6281 Muscle weakness (generalized): Secondary | ICD-10-CM | POA: Diagnosis not present

## 2017-06-19 DIAGNOSIS — K5901 Slow transit constipation: Secondary | ICD-10-CM | POA: Diagnosis not present

## 2017-06-19 DIAGNOSIS — E785 Hyperlipidemia, unspecified: Secondary | ICD-10-CM | POA: Diagnosis not present

## 2017-06-19 DIAGNOSIS — L853 Xerosis cutis: Secondary | ICD-10-CM | POA: Diagnosis not present

## 2017-06-21 DIAGNOSIS — M6281 Muscle weakness (generalized): Secondary | ICD-10-CM | POA: Diagnosis not present

## 2017-06-21 DIAGNOSIS — H04123 Dry eye syndrome of bilateral lacrimal glands: Secondary | ICD-10-CM | POA: Diagnosis not present

## 2017-06-21 DIAGNOSIS — G894 Chronic pain syndrome: Secondary | ICD-10-CM | POA: Diagnosis not present

## 2017-06-21 DIAGNOSIS — K5901 Slow transit constipation: Secondary | ICD-10-CM | POA: Diagnosis not present

## 2017-06-21 DIAGNOSIS — E039 Hypothyroidism, unspecified: Secondary | ICD-10-CM | POA: Diagnosis not present

## 2017-06-21 DIAGNOSIS — B372 Candidiasis of skin and nail: Secondary | ICD-10-CM | POA: Diagnosis not present

## 2017-06-21 DIAGNOSIS — H409 Unspecified glaucoma: Secondary | ICD-10-CM | POA: Diagnosis not present

## 2017-06-21 DIAGNOSIS — Z7409 Other reduced mobility: Secondary | ICD-10-CM | POA: Diagnosis not present

## 2017-06-21 DIAGNOSIS — Z7982 Long term (current) use of aspirin: Secondary | ICD-10-CM | POA: Diagnosis not present

## 2017-06-21 DIAGNOSIS — L853 Xerosis cutis: Secondary | ICD-10-CM | POA: Diagnosis not present

## 2017-06-21 DIAGNOSIS — I482 Chronic atrial fibrillation: Secondary | ICD-10-CM | POA: Diagnosis not present

## 2017-06-21 DIAGNOSIS — B373 Candidiasis of vulva and vagina: Secondary | ICD-10-CM | POA: Diagnosis not present

## 2017-07-03 DIAGNOSIS — F419 Anxiety disorder, unspecified: Secondary | ICD-10-CM | POA: Diagnosis not present

## 2017-07-03 DIAGNOSIS — F0390 Unspecified dementia without behavioral disturbance: Secondary | ICD-10-CM | POA: Diagnosis not present

## 2017-07-06 DIAGNOSIS — F0390 Unspecified dementia without behavioral disturbance: Secondary | ICD-10-CM | POA: Diagnosis not present

## 2017-07-06 DIAGNOSIS — F419 Anxiety disorder, unspecified: Secondary | ICD-10-CM | POA: Diagnosis not present

## 2017-07-19 DIAGNOSIS — H409 Unspecified glaucoma: Secondary | ICD-10-CM | POA: Diagnosis not present

## 2017-07-19 DIAGNOSIS — Z7409 Other reduced mobility: Secondary | ICD-10-CM | POA: Diagnosis not present

## 2017-07-19 DIAGNOSIS — B372 Candidiasis of skin and nail: Secondary | ICD-10-CM | POA: Diagnosis not present

## 2017-07-19 DIAGNOSIS — Z7982 Long term (current) use of aspirin: Secondary | ICD-10-CM | POA: Diagnosis not present

## 2017-07-19 DIAGNOSIS — E039 Hypothyroidism, unspecified: Secondary | ICD-10-CM | POA: Diagnosis not present

## 2017-07-19 DIAGNOSIS — I482 Chronic atrial fibrillation: Secondary | ICD-10-CM | POA: Diagnosis not present

## 2017-07-19 DIAGNOSIS — B373 Candidiasis of vulva and vagina: Secondary | ICD-10-CM | POA: Diagnosis not present

## 2017-07-19 DIAGNOSIS — K5901 Slow transit constipation: Secondary | ICD-10-CM | POA: Diagnosis not present

## 2017-07-19 DIAGNOSIS — G894 Chronic pain syndrome: Secondary | ICD-10-CM | POA: Diagnosis not present

## 2017-07-19 DIAGNOSIS — H04123 Dry eye syndrome of bilateral lacrimal glands: Secondary | ICD-10-CM | POA: Diagnosis not present

## 2017-07-19 DIAGNOSIS — L853 Xerosis cutis: Secondary | ICD-10-CM | POA: Diagnosis not present

## 2017-07-19 DIAGNOSIS — M6281 Muscle weakness (generalized): Secondary | ICD-10-CM | POA: Diagnosis not present

## 2017-07-20 DIAGNOSIS — Z7982 Long term (current) use of aspirin: Secondary | ICD-10-CM | POA: Diagnosis not present

## 2017-07-20 DIAGNOSIS — F0390 Unspecified dementia without behavioral disturbance: Secondary | ICD-10-CM | POA: Diagnosis not present

## 2017-07-20 DIAGNOSIS — Z7409 Other reduced mobility: Secondary | ICD-10-CM | POA: Diagnosis not present

## 2017-07-20 DIAGNOSIS — E039 Hypothyroidism, unspecified: Secondary | ICD-10-CM | POA: Diagnosis not present

## 2017-07-20 DIAGNOSIS — L853 Xerosis cutis: Secondary | ICD-10-CM | POA: Diagnosis not present

## 2017-07-20 DIAGNOSIS — F419 Anxiety disorder, unspecified: Secondary | ICD-10-CM | POA: Diagnosis not present

## 2017-07-20 DIAGNOSIS — M6281 Muscle weakness (generalized): Secondary | ICD-10-CM | POA: Diagnosis not present

## 2017-07-20 DIAGNOSIS — E785 Hyperlipidemia, unspecified: Secondary | ICD-10-CM | POA: Diagnosis not present

## 2017-07-20 DIAGNOSIS — G894 Chronic pain syndrome: Secondary | ICD-10-CM | POA: Diagnosis not present

## 2017-07-20 DIAGNOSIS — H409 Unspecified glaucoma: Secondary | ICD-10-CM | POA: Diagnosis not present

## 2017-07-20 DIAGNOSIS — I482 Chronic atrial fibrillation: Secondary | ICD-10-CM | POA: Diagnosis not present

## 2017-07-20 DIAGNOSIS — K5901 Slow transit constipation: Secondary | ICD-10-CM | POA: Diagnosis not present

## 2017-08-01 DIAGNOSIS — F419 Anxiety disorder, unspecified: Secondary | ICD-10-CM | POA: Diagnosis not present

## 2017-08-01 DIAGNOSIS — F0390 Unspecified dementia without behavioral disturbance: Secondary | ICD-10-CM | POA: Diagnosis not present

## 2017-08-03 DIAGNOSIS — F0390 Unspecified dementia without behavioral disturbance: Secondary | ICD-10-CM | POA: Diagnosis not present

## 2017-08-03 DIAGNOSIS — F419 Anxiety disorder, unspecified: Secondary | ICD-10-CM | POA: Diagnosis not present

## 2017-08-16 DIAGNOSIS — E039 Hypothyroidism, unspecified: Secondary | ICD-10-CM | POA: Diagnosis not present

## 2017-08-16 DIAGNOSIS — B373 Candidiasis of vulva and vagina: Secondary | ICD-10-CM | POA: Diagnosis not present

## 2017-08-16 DIAGNOSIS — H04123 Dry eye syndrome of bilateral lacrimal glands: Secondary | ICD-10-CM | POA: Diagnosis not present

## 2017-08-16 DIAGNOSIS — Z7409 Other reduced mobility: Secondary | ICD-10-CM | POA: Diagnosis not present

## 2017-08-16 DIAGNOSIS — I482 Chronic atrial fibrillation: Secondary | ICD-10-CM | POA: Diagnosis not present

## 2017-08-16 DIAGNOSIS — Z7982 Long term (current) use of aspirin: Secondary | ICD-10-CM | POA: Diagnosis not present

## 2017-08-16 DIAGNOSIS — G894 Chronic pain syndrome: Secondary | ICD-10-CM | POA: Diagnosis not present

## 2017-08-16 DIAGNOSIS — B372 Candidiasis of skin and nail: Secondary | ICD-10-CM | POA: Diagnosis not present

## 2017-08-16 DIAGNOSIS — M21619 Bunion of unspecified foot: Secondary | ICD-10-CM | POA: Diagnosis not present

## 2017-08-16 DIAGNOSIS — L853 Xerosis cutis: Secondary | ICD-10-CM | POA: Diagnosis not present

## 2017-08-16 DIAGNOSIS — K5901 Slow transit constipation: Secondary | ICD-10-CM | POA: Diagnosis not present

## 2017-08-16 DIAGNOSIS — M6281 Muscle weakness (generalized): Secondary | ICD-10-CM | POA: Diagnosis not present

## 2017-08-17 DIAGNOSIS — Z7982 Long term (current) use of aspirin: Secondary | ICD-10-CM | POA: Diagnosis not present

## 2017-08-17 DIAGNOSIS — K5901 Slow transit constipation: Secondary | ICD-10-CM | POA: Diagnosis not present

## 2017-08-17 DIAGNOSIS — G894 Chronic pain syndrome: Secondary | ICD-10-CM | POA: Diagnosis not present

## 2017-08-17 DIAGNOSIS — F0390 Unspecified dementia without behavioral disturbance: Secondary | ICD-10-CM | POA: Diagnosis not present

## 2017-08-17 DIAGNOSIS — M6281 Muscle weakness (generalized): Secondary | ICD-10-CM | POA: Diagnosis not present

## 2017-08-17 DIAGNOSIS — I482 Chronic atrial fibrillation: Secondary | ICD-10-CM | POA: Diagnosis not present

## 2017-08-17 DIAGNOSIS — L853 Xerosis cutis: Secondary | ICD-10-CM | POA: Diagnosis not present

## 2017-08-17 DIAGNOSIS — Z7409 Other reduced mobility: Secondary | ICD-10-CM | POA: Diagnosis not present

## 2017-08-17 DIAGNOSIS — H409 Unspecified glaucoma: Secondary | ICD-10-CM | POA: Diagnosis not present

## 2017-08-17 DIAGNOSIS — E785 Hyperlipidemia, unspecified: Secondary | ICD-10-CM | POA: Diagnosis not present

## 2017-08-17 DIAGNOSIS — E039 Hypothyroidism, unspecified: Secondary | ICD-10-CM | POA: Diagnosis not present

## 2017-08-17 DIAGNOSIS — F419 Anxiety disorder, unspecified: Secondary | ICD-10-CM | POA: Diagnosis not present

## 2017-08-22 DIAGNOSIS — R4182 Altered mental status, unspecified: Secondary | ICD-10-CM | POA: Diagnosis not present

## 2017-08-30 DIAGNOSIS — G894 Chronic pain syndrome: Secondary | ICD-10-CM | POA: Diagnosis not present

## 2017-08-30 DIAGNOSIS — M545 Low back pain: Secondary | ICD-10-CM | POA: Diagnosis not present

## 2017-09-07 DIAGNOSIS — F419 Anxiety disorder, unspecified: Secondary | ICD-10-CM | POA: Diagnosis not present

## 2017-09-07 DIAGNOSIS — F331 Major depressive disorder, recurrent, moderate: Secondary | ICD-10-CM | POA: Diagnosis not present

## 2017-09-07 DIAGNOSIS — F0390 Unspecified dementia without behavioral disturbance: Secondary | ICD-10-CM | POA: Diagnosis not present

## 2017-09-11 DIAGNOSIS — L603 Nail dystrophy: Secondary | ICD-10-CM | POA: Diagnosis not present

## 2017-09-11 DIAGNOSIS — L853 Xerosis cutis: Secondary | ICD-10-CM | POA: Diagnosis not present

## 2017-09-11 DIAGNOSIS — B351 Tinea unguium: Secondary | ICD-10-CM | POA: Diagnosis not present

## 2017-09-11 DIAGNOSIS — R600 Localized edema: Secondary | ICD-10-CM | POA: Diagnosis not present

## 2017-09-11 DIAGNOSIS — M2010 Hallux valgus (acquired), unspecified foot: Secondary | ICD-10-CM | POA: Diagnosis not present

## 2017-09-11 DIAGNOSIS — Q845 Enlarged and hypertrophic nails: Secondary | ICD-10-CM | POA: Diagnosis not present

## 2017-09-13 DIAGNOSIS — Z7409 Other reduced mobility: Secondary | ICD-10-CM | POA: Diagnosis not present

## 2017-09-13 DIAGNOSIS — G894 Chronic pain syndrome: Secondary | ICD-10-CM | POA: Diagnosis not present

## 2017-09-13 DIAGNOSIS — H04123 Dry eye syndrome of bilateral lacrimal glands: Secondary | ICD-10-CM | POA: Diagnosis not present

## 2017-09-13 DIAGNOSIS — B373 Candidiasis of vulva and vagina: Secondary | ICD-10-CM | POA: Diagnosis not present

## 2017-09-13 DIAGNOSIS — L853 Xerosis cutis: Secondary | ICD-10-CM | POA: Diagnosis not present

## 2017-09-13 DIAGNOSIS — B372 Candidiasis of skin and nail: Secondary | ICD-10-CM | POA: Diagnosis not present

## 2017-09-13 DIAGNOSIS — I482 Chronic atrial fibrillation: Secondary | ICD-10-CM | POA: Diagnosis not present

## 2017-09-13 DIAGNOSIS — E039 Hypothyroidism, unspecified: Secondary | ICD-10-CM | POA: Diagnosis not present

## 2017-09-13 DIAGNOSIS — M6281 Muscle weakness (generalized): Secondary | ICD-10-CM | POA: Diagnosis not present

## 2017-09-13 DIAGNOSIS — M21619 Bunion of unspecified foot: Secondary | ICD-10-CM | POA: Diagnosis not present

## 2017-09-13 DIAGNOSIS — K5901 Slow transit constipation: Secondary | ICD-10-CM | POA: Diagnosis not present

## 2017-09-13 DIAGNOSIS — Z7982 Long term (current) use of aspirin: Secondary | ICD-10-CM | POA: Diagnosis not present

## 2017-09-14 DIAGNOSIS — I10 Essential (primary) hypertension: Secondary | ICD-10-CM | POA: Diagnosis not present

## 2017-09-14 DIAGNOSIS — E782 Mixed hyperlipidemia: Secondary | ICD-10-CM | POA: Diagnosis not present

## 2017-09-14 DIAGNOSIS — E039 Hypothyroidism, unspecified: Secondary | ICD-10-CM | POA: Diagnosis not present

## 2017-09-15 DIAGNOSIS — M6281 Muscle weakness (generalized): Secondary | ICD-10-CM | POA: Diagnosis not present

## 2017-09-15 DIAGNOSIS — G894 Chronic pain syndrome: Secondary | ICD-10-CM | POA: Diagnosis not present

## 2017-09-15 DIAGNOSIS — E039 Hypothyroidism, unspecified: Secondary | ICD-10-CM | POA: Diagnosis not present

## 2017-09-15 DIAGNOSIS — Z7982 Long term (current) use of aspirin: Secondary | ICD-10-CM | POA: Diagnosis not present

## 2017-09-15 DIAGNOSIS — E785 Hyperlipidemia, unspecified: Secondary | ICD-10-CM | POA: Diagnosis not present

## 2017-09-15 DIAGNOSIS — Z7409 Other reduced mobility: Secondary | ICD-10-CM | POA: Diagnosis not present

## 2017-09-15 DIAGNOSIS — F0390 Unspecified dementia without behavioral disturbance: Secondary | ICD-10-CM | POA: Diagnosis not present

## 2017-09-15 DIAGNOSIS — K5901 Slow transit constipation: Secondary | ICD-10-CM | POA: Diagnosis not present

## 2017-09-15 DIAGNOSIS — H409 Unspecified glaucoma: Secondary | ICD-10-CM | POA: Diagnosis not present

## 2017-09-15 DIAGNOSIS — L853 Xerosis cutis: Secondary | ICD-10-CM | POA: Diagnosis not present

## 2017-09-15 DIAGNOSIS — F419 Anxiety disorder, unspecified: Secondary | ICD-10-CM | POA: Diagnosis not present

## 2017-09-15 DIAGNOSIS — I482 Chronic atrial fibrillation: Secondary | ICD-10-CM | POA: Diagnosis not present

## 2017-09-16 DIAGNOSIS — Z23 Encounter for immunization: Secondary | ICD-10-CM | POA: Diagnosis not present

## 2017-09-20 DIAGNOSIS — M545 Low back pain: Secondary | ICD-10-CM | POA: Diagnosis not present

## 2017-09-21 DIAGNOSIS — F0390 Unspecified dementia without behavioral disturbance: Secondary | ICD-10-CM | POA: Diagnosis not present

## 2017-09-21 DIAGNOSIS — F419 Anxiety disorder, unspecified: Secondary | ICD-10-CM | POA: Diagnosis not present

## 2017-09-21 DIAGNOSIS — F331 Major depressive disorder, recurrent, moderate: Secondary | ICD-10-CM | POA: Diagnosis not present

## 2017-09-27 DIAGNOSIS — M545 Low back pain: Secondary | ICD-10-CM | POA: Diagnosis not present

## 2017-09-27 DIAGNOSIS — M2578 Osteophyte, vertebrae: Secondary | ICD-10-CM | POA: Diagnosis not present

## 2017-09-27 DIAGNOSIS — G894 Chronic pain syndrome: Secondary | ICD-10-CM | POA: Diagnosis not present

## 2017-10-02 DIAGNOSIS — F419 Anxiety disorder, unspecified: Secondary | ICD-10-CM | POA: Diagnosis not present

## 2017-10-02 DIAGNOSIS — F0390 Unspecified dementia without behavioral disturbance: Secondary | ICD-10-CM | POA: Diagnosis not present

## 2017-10-05 DIAGNOSIS — F331 Major depressive disorder, recurrent, moderate: Secondary | ICD-10-CM | POA: Diagnosis not present

## 2017-10-05 DIAGNOSIS — F0390 Unspecified dementia without behavioral disturbance: Secondary | ICD-10-CM | POA: Diagnosis not present

## 2017-10-05 DIAGNOSIS — F419 Anxiety disorder, unspecified: Secondary | ICD-10-CM | POA: Diagnosis not present

## 2017-10-11 DIAGNOSIS — B372 Candidiasis of skin and nail: Secondary | ICD-10-CM | POA: Diagnosis not present

## 2017-10-11 DIAGNOSIS — Z7982 Long term (current) use of aspirin: Secondary | ICD-10-CM | POA: Diagnosis not present

## 2017-10-11 DIAGNOSIS — H409 Unspecified glaucoma: Secondary | ICD-10-CM | POA: Diagnosis not present

## 2017-10-11 DIAGNOSIS — K5901 Slow transit constipation: Secondary | ICD-10-CM | POA: Diagnosis not present

## 2017-10-11 DIAGNOSIS — Z7409 Other reduced mobility: Secondary | ICD-10-CM | POA: Diagnosis not present

## 2017-10-11 DIAGNOSIS — E039 Hypothyroidism, unspecified: Secondary | ICD-10-CM | POA: Diagnosis not present

## 2017-10-11 DIAGNOSIS — G894 Chronic pain syndrome: Secondary | ICD-10-CM | POA: Diagnosis not present

## 2017-10-11 DIAGNOSIS — I482 Chronic atrial fibrillation: Secondary | ICD-10-CM | POA: Diagnosis not present

## 2017-10-11 DIAGNOSIS — B373 Candidiasis of vulva and vagina: Secondary | ICD-10-CM | POA: Diagnosis not present

## 2017-10-11 DIAGNOSIS — L853 Xerosis cutis: Secondary | ICD-10-CM | POA: Diagnosis not present

## 2017-10-11 DIAGNOSIS — H04123 Dry eye syndrome of bilateral lacrimal glands: Secondary | ICD-10-CM | POA: Diagnosis not present

## 2017-10-11 DIAGNOSIS — M6281 Muscle weakness (generalized): Secondary | ICD-10-CM | POA: Diagnosis not present

## 2017-10-17 DIAGNOSIS — E785 Hyperlipidemia, unspecified: Secondary | ICD-10-CM | POA: Diagnosis not present

## 2017-10-17 DIAGNOSIS — G894 Chronic pain syndrome: Secondary | ICD-10-CM | POA: Diagnosis not present

## 2017-10-17 DIAGNOSIS — I482 Chronic atrial fibrillation: Secondary | ICD-10-CM | POA: Diagnosis not present

## 2017-10-17 DIAGNOSIS — K5901 Slow transit constipation: Secondary | ICD-10-CM | POA: Diagnosis not present

## 2017-10-17 DIAGNOSIS — E039 Hypothyroidism, unspecified: Secondary | ICD-10-CM | POA: Diagnosis not present

## 2017-10-17 DIAGNOSIS — H409 Unspecified glaucoma: Secondary | ICD-10-CM | POA: Diagnosis not present

## 2017-10-17 DIAGNOSIS — F419 Anxiety disorder, unspecified: Secondary | ICD-10-CM | POA: Diagnosis not present

## 2017-10-17 DIAGNOSIS — L853 Xerosis cutis: Secondary | ICD-10-CM | POA: Diagnosis not present

## 2017-10-17 DIAGNOSIS — M6281 Muscle weakness (generalized): Secondary | ICD-10-CM | POA: Diagnosis not present

## 2017-10-17 DIAGNOSIS — Z7409 Other reduced mobility: Secondary | ICD-10-CM | POA: Diagnosis not present

## 2017-10-17 DIAGNOSIS — Z7982 Long term (current) use of aspirin: Secondary | ICD-10-CM | POA: Diagnosis not present

## 2017-10-17 DIAGNOSIS — F0390 Unspecified dementia without behavioral disturbance: Secondary | ICD-10-CM | POA: Diagnosis not present

## 2017-10-19 DIAGNOSIS — R300 Dysuria: Secondary | ICD-10-CM | POA: Diagnosis not present

## 2017-10-20 DIAGNOSIS — R300 Dysuria: Secondary | ICD-10-CM | POA: Diagnosis not present

## 2017-10-23 DIAGNOSIS — F0390 Unspecified dementia without behavioral disturbance: Secondary | ICD-10-CM | POA: Diagnosis not present

## 2017-10-23 DIAGNOSIS — F331 Major depressive disorder, recurrent, moderate: Secondary | ICD-10-CM | POA: Diagnosis not present

## 2017-10-23 DIAGNOSIS — F419 Anxiety disorder, unspecified: Secondary | ICD-10-CM | POA: Diagnosis not present

## 2017-11-02 DIAGNOSIS — Z79899 Other long term (current) drug therapy: Secondary | ICD-10-CM | POA: Diagnosis not present

## 2017-11-06 DIAGNOSIS — F419 Anxiety disorder, unspecified: Secondary | ICD-10-CM | POA: Diagnosis not present

## 2017-11-06 DIAGNOSIS — F0390 Unspecified dementia without behavioral disturbance: Secondary | ICD-10-CM | POA: Diagnosis not present

## 2017-11-06 DIAGNOSIS — F331 Major depressive disorder, recurrent, moderate: Secondary | ICD-10-CM | POA: Diagnosis not present

## 2017-11-16 DIAGNOSIS — F0390 Unspecified dementia without behavioral disturbance: Secondary | ICD-10-CM | POA: Diagnosis not present

## 2017-11-16 DIAGNOSIS — G894 Chronic pain syndrome: Secondary | ICD-10-CM | POA: Diagnosis not present

## 2017-11-16 DIAGNOSIS — M6281 Muscle weakness (generalized): Secondary | ICD-10-CM | POA: Diagnosis not present

## 2017-11-16 DIAGNOSIS — I482 Chronic atrial fibrillation: Secondary | ICD-10-CM | POA: Diagnosis not present

## 2017-11-16 DIAGNOSIS — E785 Hyperlipidemia, unspecified: Secondary | ICD-10-CM | POA: Diagnosis not present

## 2017-11-16 DIAGNOSIS — L853 Xerosis cutis: Secondary | ICD-10-CM | POA: Diagnosis not present

## 2017-11-16 DIAGNOSIS — M2578 Osteophyte, vertebrae: Secondary | ICD-10-CM | POA: Diagnosis not present

## 2017-11-16 DIAGNOSIS — K5901 Slow transit constipation: Secondary | ICD-10-CM | POA: Diagnosis not present

## 2017-11-16 DIAGNOSIS — Z7409 Other reduced mobility: Secondary | ICD-10-CM | POA: Diagnosis not present

## 2017-11-16 DIAGNOSIS — H409 Unspecified glaucoma: Secondary | ICD-10-CM | POA: Diagnosis not present

## 2017-11-16 DIAGNOSIS — F419 Anxiety disorder, unspecified: Secondary | ICD-10-CM | POA: Diagnosis not present

## 2017-11-16 DIAGNOSIS — E039 Hypothyroidism, unspecified: Secondary | ICD-10-CM | POA: Diagnosis not present

## 2017-11-20 DIAGNOSIS — F0390 Unspecified dementia without behavioral disturbance: Secondary | ICD-10-CM | POA: Diagnosis not present

## 2017-11-20 DIAGNOSIS — F419 Anxiety disorder, unspecified: Secondary | ICD-10-CM | POA: Diagnosis not present

## 2017-12-28 ENCOUNTER — Emergency Department (HOSPITAL_COMMUNITY): Payer: Medicare Other

## 2017-12-28 ENCOUNTER — Other Ambulatory Visit: Payer: Self-pay

## 2017-12-28 ENCOUNTER — Encounter (HOSPITAL_COMMUNITY): Payer: Self-pay

## 2017-12-28 ENCOUNTER — Inpatient Hospital Stay (HOSPITAL_COMMUNITY)
Admission: EM | Admit: 2017-12-28 | Discharge: 2018-01-19 | DRG: 871 | Disposition: E | Payer: Medicare Other | Attending: Internal Medicine | Admitting: Internal Medicine

## 2017-12-28 DIAGNOSIS — E872 Acidosis: Secondary | ICD-10-CM | POA: Diagnosis present

## 2017-12-28 DIAGNOSIS — E7849 Other hyperlipidemia: Secondary | ICD-10-CM | POA: Diagnosis not present

## 2017-12-28 DIAGNOSIS — I130 Hypertensive heart and chronic kidney disease with heart failure and stage 1 through stage 4 chronic kidney disease, or unspecified chronic kidney disease: Secondary | ICD-10-CM | POA: Diagnosis present

## 2017-12-28 DIAGNOSIS — I482 Chronic atrial fibrillation, unspecified: Secondary | ICD-10-CM | POA: Diagnosis present

## 2017-12-28 DIAGNOSIS — J09X1 Influenza due to identified novel influenza A virus with pneumonia: Secondary | ICD-10-CM | POA: Diagnosis not present

## 2017-12-28 DIAGNOSIS — F0151 Vascular dementia with behavioral disturbance: Secondary | ICD-10-CM | POA: Diagnosis not present

## 2017-12-28 DIAGNOSIS — Z515 Encounter for palliative care: Secondary | ICD-10-CM | POA: Diagnosis present

## 2017-12-28 DIAGNOSIS — E039 Hypothyroidism, unspecified: Secondary | ICD-10-CM | POA: Diagnosis present

## 2017-12-28 DIAGNOSIS — N170 Acute kidney failure with tubular necrosis: Secondary | ICD-10-CM | POA: Diagnosis not present

## 2017-12-28 DIAGNOSIS — Y92230 Patient room in hospital as the place of occurrence of the external cause: Secondary | ICD-10-CM | POA: Diagnosis present

## 2017-12-28 DIAGNOSIS — Z66 Do not resuscitate: Secondary | ICD-10-CM | POA: Diagnosis present

## 2017-12-28 DIAGNOSIS — E785 Hyperlipidemia, unspecified: Secondary | ICD-10-CM | POA: Diagnosis present

## 2017-12-28 DIAGNOSIS — I214 Non-ST elevation (NSTEMI) myocardial infarction: Secondary | ICD-10-CM

## 2017-12-28 DIAGNOSIS — E1165 Type 2 diabetes mellitus with hyperglycemia: Secondary | ICD-10-CM | POA: Diagnosis present

## 2017-12-28 DIAGNOSIS — I21A1 Myocardial infarction type 2: Secondary | ICD-10-CM | POA: Diagnosis present

## 2017-12-28 DIAGNOSIS — J189 Pneumonia, unspecified organism: Secondary | ICD-10-CM | POA: Diagnosis present

## 2017-12-28 DIAGNOSIS — N179 Acute kidney failure, unspecified: Secondary | ICD-10-CM | POA: Diagnosis present

## 2017-12-28 DIAGNOSIS — N183 Chronic kidney disease, stage 3 unspecified: Secondary | ICD-10-CM | POA: Diagnosis present

## 2017-12-28 DIAGNOSIS — J101 Influenza due to other identified influenza virus with other respiratory manifestations: Secondary | ICD-10-CM | POA: Diagnosis present

## 2017-12-28 DIAGNOSIS — J9601 Acute respiratory failure with hypoxia: Secondary | ICD-10-CM | POA: Diagnosis present

## 2017-12-28 DIAGNOSIS — I10 Essential (primary) hypertension: Secondary | ICD-10-CM | POA: Diagnosis present

## 2017-12-28 DIAGNOSIS — Z7982 Long term (current) use of aspirin: Secondary | ICD-10-CM

## 2017-12-28 DIAGNOSIS — N200 Calculus of kidney: Secondary | ICD-10-CM | POA: Diagnosis present

## 2017-12-28 DIAGNOSIS — I5031 Acute diastolic (congestive) heart failure: Secondary | ICD-10-CM | POA: Diagnosis present

## 2017-12-28 DIAGNOSIS — J690 Pneumonitis due to inhalation of food and vomit: Secondary | ICD-10-CM | POA: Diagnosis present

## 2017-12-28 DIAGNOSIS — G9341 Metabolic encephalopathy: Secondary | ICD-10-CM | POA: Diagnosis present

## 2017-12-28 DIAGNOSIS — I340 Nonrheumatic mitral (valve) insufficiency: Secondary | ICD-10-CM | POA: Diagnosis not present

## 2017-12-28 DIAGNOSIS — A419 Sepsis, unspecified organism: Principal | ICD-10-CM

## 2017-12-28 DIAGNOSIS — I4891 Unspecified atrial fibrillation: Secondary | ICD-10-CM

## 2017-12-28 DIAGNOSIS — F419 Anxiety disorder, unspecified: Secondary | ICD-10-CM | POA: Diagnosis present

## 2017-12-28 DIAGNOSIS — E1122 Type 2 diabetes mellitus with diabetic chronic kidney disease: Secondary | ICD-10-CM | POA: Diagnosis present

## 2017-12-28 DIAGNOSIS — T380X5A Adverse effect of glucocorticoids and synthetic analogues, initial encounter: Secondary | ICD-10-CM | POA: Diagnosis present

## 2017-12-28 DIAGNOSIS — F0390 Unspecified dementia without behavioral disturbance: Secondary | ICD-10-CM | POA: Diagnosis present

## 2017-12-28 DIAGNOSIS — F0150 Vascular dementia without behavioral disturbance: Secondary | ICD-10-CM | POA: Diagnosis not present

## 2017-12-28 DIAGNOSIS — R6521 Severe sepsis with septic shock: Secondary | ICD-10-CM | POA: Diagnosis present

## 2017-12-28 DIAGNOSIS — E119 Type 2 diabetes mellitus without complications: Secondary | ICD-10-CM

## 2017-12-28 DIAGNOSIS — Z6825 Body mass index (BMI) 25.0-25.9, adult: Secondary | ICD-10-CM

## 2017-12-28 DIAGNOSIS — R54 Age-related physical debility: Secondary | ICD-10-CM | POA: Diagnosis present

## 2017-12-28 DIAGNOSIS — I361 Nonrheumatic tricuspid (valve) insufficiency: Secondary | ICD-10-CM | POA: Diagnosis not present

## 2017-12-28 HISTORY — DX: Unspecified dementia, unspecified severity, without behavioral disturbance, psychotic disturbance, mood disturbance, and anxiety: F03.90

## 2017-12-28 HISTORY — DX: Essential (primary) hypertension: I10

## 2017-12-28 LAB — I-STAT CHEM 8, ED
BUN: 32 mg/dL — AB (ref 6–20)
CALCIUM ION: 1.07 mmol/L — AB (ref 1.15–1.40)
CHLORIDE: 104 mmol/L (ref 101–111)
Creatinine, Ser: 1.6 mg/dL — ABNORMAL HIGH (ref 0.44–1.00)
Glucose, Bld: 226 mg/dL — ABNORMAL HIGH (ref 65–99)
HEMATOCRIT: 38 % (ref 36.0–46.0)
Hemoglobin: 12.9 g/dL (ref 12.0–15.0)
Potassium: 3.5 mmol/L (ref 3.5–5.1)
SODIUM: 140 mmol/L (ref 135–145)
TCO2: 17 mmol/L — ABNORMAL LOW (ref 22–32)

## 2017-12-28 LAB — URINALYSIS, ROUTINE W REFLEX MICROSCOPIC
Bilirubin Urine: NEGATIVE
GLUCOSE, UA: 50 mg/dL — AB
HGB URINE DIPSTICK: NEGATIVE
Ketones, ur: NEGATIVE mg/dL
LEUKOCYTES UA: NEGATIVE
NITRITE: NEGATIVE
Protein, ur: 100 mg/dL — AB
SPECIFIC GRAVITY, URINE: 1.023 (ref 1.005–1.030)
pH: 5 (ref 5.0–8.0)

## 2017-12-28 LAB — I-STAT VENOUS BLOOD GAS, ED
Acid-base deficit: 12 mmol/L — ABNORMAL HIGH (ref 0.0–2.0)
Bicarbonate: 14.8 mmol/L — ABNORMAL LOW (ref 20.0–28.0)
O2 Saturation: 89 %
PH VEN: 7.207 — AB (ref 7.250–7.430)
TCO2: 16 mmol/L — ABNORMAL LOW (ref 22–32)
pCO2, Ven: 37.3 mmHg — ABNORMAL LOW (ref 44.0–60.0)
pO2, Ven: 67 mmHg — ABNORMAL HIGH (ref 32.0–45.0)

## 2017-12-28 LAB — CBC WITH DIFFERENTIAL/PLATELET
Basophils Absolute: 0 10*3/uL (ref 0.0–0.1)
Basophils Relative: 0 %
EOS PCT: 0 %
Eosinophils Absolute: 0 10*3/uL (ref 0.0–0.7)
HEMATOCRIT: 40 % (ref 36.0–46.0)
HEMOGLOBIN: 12.3 g/dL (ref 12.0–15.0)
Lymphocytes Relative: 8 %
Lymphs Abs: 1 10*3/uL (ref 0.7–4.0)
MCH: 29.1 pg (ref 26.0–34.0)
MCHC: 30.8 g/dL (ref 30.0–36.0)
MCV: 94.6 fL (ref 78.0–100.0)
MONOS PCT: 4 %
Monocytes Absolute: 0.5 10*3/uL (ref 0.1–1.0)
NEUTROS ABS: 10.7 10*3/uL — AB (ref 1.7–7.7)
Neutrophils Relative %: 88 %
Platelets: 178 10*3/uL (ref 150–400)
RBC: 4.23 MIL/uL (ref 3.87–5.11)
RDW: 14.1 % (ref 11.5–15.5)
WBC: 12.2 10*3/uL — AB (ref 4.0–10.5)

## 2017-12-28 LAB — I-STAT CG4 LACTIC ACID, ED
Lactic Acid, Venous: 10.04 mmol/L (ref 0.5–1.9)
Lactic Acid, Venous: 6.84 mmol/L (ref 0.5–1.9)

## 2017-12-28 LAB — BRAIN NATRIURETIC PEPTIDE
B Natriuretic Peptide: 1877.3 pg/mL — ABNORMAL HIGH (ref 0.0–100.0)
B Natriuretic Peptide: 1605.1 pg/mL — ABNORMAL HIGH (ref 0.0–100.0)

## 2017-12-28 LAB — COMPREHENSIVE METABOLIC PANEL
ALBUMIN: 3.4 g/dL — AB (ref 3.5–5.0)
ALT: 49 U/L (ref 14–54)
AST: 104 U/L — AB (ref 15–41)
Alkaline Phosphatase: 59 U/L (ref 38–126)
Anion gap: 22 — ABNORMAL HIGH (ref 5–15)
BUN: 31 mg/dL — AB (ref 6–20)
CHLORIDE: 103 mmol/L (ref 101–111)
CO2: 14 mmol/L — AB (ref 22–32)
CREATININE: 1.95 mg/dL — AB (ref 0.44–1.00)
Calcium: 9.2 mg/dL (ref 8.9–10.3)
GFR calc Af Amer: 24 mL/min — ABNORMAL LOW (ref >60)
GFR calc non Af Amer: 21 mL/min — ABNORMAL LOW (ref >60)
GLUCOSE: 245 mg/dL — AB (ref 65–99)
Potassium: 3.6 mmol/L (ref 3.5–5.1)
SODIUM: 139 mmol/L (ref 135–145)
Total Bilirubin: 1.2 mg/dL (ref 0.3–1.2)
Total Protein: 6.6 g/dL (ref 6.5–8.1)

## 2017-12-28 LAB — LIPASE, BLOOD: Lipase: 24 U/L (ref 11–51)

## 2017-12-28 LAB — I-STAT TROPONIN, ED: TROPONIN I, POC: 2.48 ng/mL — AB (ref 0.00–0.08)

## 2017-12-28 LAB — MRSA PCR SCREENING: MRSA by PCR: NEGATIVE

## 2017-12-28 LAB — CBG MONITORING, ED: Glucose-Capillary: 202 mg/dL — ABNORMAL HIGH (ref 65–99)

## 2017-12-28 MED ORDER — SODIUM CHLORIDE 0.9 % IV SOLN
INTRAVENOUS | Status: AC
  Administered 2017-12-28: 15:00:00 via INTRAVENOUS
  Administered 2017-12-28: 1000 mL via INTRAVENOUS
  Administered 2017-12-29 – 2017-12-30 (×2): via INTRAVENOUS

## 2017-12-28 MED ORDER — TIMOLOL MALEATE 0.5 % OP SOLN
1.0000 [drp] | Freq: Two times a day (BID) | OPHTHALMIC | Status: DC
Start: 2017-12-28 — End: 2018-01-01
  Administered 2017-12-28 – 2018-01-01 (×8): 1 [drp] via OPHTHALMIC
  Filled 2017-12-28: qty 5

## 2017-12-28 MED ORDER — ASPIRIN EC 81 MG PO TBEC
81.0000 mg | DELAYED_RELEASE_TABLET | Freq: Every day | ORAL | Status: DC
  Administered 2017-12-29 – 2017-12-30 (×2): 81 mg via ORAL
  Filled 2017-12-28 (×3): qty 1

## 2017-12-28 MED ORDER — DEXTROSE 5 % IV SOLN
1.0000 g | INTRAVENOUS | Status: DC
  Administered 2017-12-29 – 2017-12-31 (×3): 1 g via INTRAVENOUS
  Filled 2017-12-28 (×4): qty 1

## 2017-12-28 MED ORDER — CHLORHEXIDINE GLUCONATE 0.12 % MT SOLN
15.0000 mL | Freq: Two times a day (BID) | OROMUCOSAL | Status: DC
  Administered 2017-12-29 – 2018-01-01 (×8): 15 mL via OROMUCOSAL
  Filled 2017-12-28 (×6): qty 15

## 2017-12-28 MED ORDER — ACETAMINOPHEN 650 MG RE SUPP
650.0000 mg | Freq: Once | RECTAL | Status: AC
  Administered 2017-12-28: 650 mg via RECTAL
  Filled 2017-12-28: qty 1

## 2017-12-28 MED ORDER — VANCOMYCIN HCL IN DEXTROSE 750-5 MG/150ML-% IV SOLN
750.0000 mg | INTRAVENOUS | Status: DC
  Administered 2017-12-29 – 2017-12-31 (×3): 750 mg via INTRAVENOUS
  Filled 2017-12-28 (×3): qty 150

## 2017-12-28 MED ORDER — SODIUM CHLORIDE 0.9 % IV BOLUS (SEPSIS)
250.0000 mL | Freq: Once | INTRAVENOUS | Status: AC
  Administered 2017-12-28: 250 mL via INTRAVENOUS

## 2017-12-28 MED ORDER — VANCOMYCIN HCL IN DEXTROSE 1-5 GM/200ML-% IV SOLN
1000.0000 mg | Freq: Once | INTRAVENOUS | Status: AC
  Administered 2017-12-28: 1000 mg via INTRAVENOUS
  Filled 2017-12-28: qty 200

## 2017-12-28 MED ORDER — SODIUM CHLORIDE 0.9 % IV BOLUS (SEPSIS)
1000.0000 mL | Freq: Once | INTRAVENOUS | Status: AC
  Administered 2017-12-28: 1000 mL via INTRAVENOUS

## 2017-12-28 MED ORDER — SERTRALINE HCL 50 MG PO TABS
50.0000 mg | ORAL_TABLET | ORAL | Status: DC
  Administered 2017-12-29: 50 mg via ORAL
  Filled 2017-12-28 (×2): qty 1

## 2017-12-28 MED ORDER — LEVOTHYROXINE SODIUM 50 MCG PO TABS
50.0000 ug | ORAL_TABLET | Freq: Every day | ORAL | Status: DC
  Administered 2017-12-29 – 2017-12-30 (×2): 50 ug via ORAL
  Filled 2017-12-28: qty 1

## 2017-12-28 MED ORDER — DEXTROSE 5 % IV SOLN
2.0000 g | Freq: Once | INTRAVENOUS | Status: AC
  Administered 2017-12-28: 2 g via INTRAVENOUS
  Filled 2017-12-28: qty 2

## 2017-12-28 MED ORDER — DEXTROSE 5 % IV SOLN: 2.0000 g | Freq: Three times a day (TID) | INTRAVENOUS | Status: DC

## 2017-12-28 MED ORDER — ADULT MULTIVITAMIN W/MINERALS CH
1.0000 | ORAL_TABLET | Freq: Every day | ORAL | Status: DC
  Administered 2017-12-30: 1 via ORAL
  Filled 2017-12-28 (×2): qty 1

## 2017-12-28 MED ORDER — ENOXAPARIN SODIUM 30 MG/0.3ML ~~LOC~~ SOLN
30.0000 mg | SUBCUTANEOUS | Status: DC
  Administered 2017-12-28 – 2017-12-31 (×4): 30 mg via SUBCUTANEOUS
  Filled 2017-12-28 (×4): qty 0.3

## 2017-12-28 MED ORDER — METOPROLOL TARTRATE 25 MG PO TABS
25.0000 mg | ORAL_TABLET | Freq: Two times a day (BID) | ORAL | Status: DC
  Administered 2017-12-29 – 2017-12-31 (×5): 25 mg via ORAL
  Filled 2017-12-28 (×6): qty 1

## 2017-12-28 MED ORDER — ACETAMINOPHEN 650 MG RE SUPP: 650.0000 mg | Freq: Four times a day (QID) | RECTAL | Status: DC | PRN

## 2017-12-28 MED ORDER — HYDROCODONE-ACETAMINOPHEN 5-325 MG PO TABS
1.0000 | ORAL_TABLET | ORAL | Status: DC | PRN
  Administered 2017-12-29 – 2017-12-30 (×2): 1 via ORAL
  Filled 2017-12-28 (×2): qty 1

## 2017-12-28 MED ORDER — BRIMONIDINE TARTRATE 0.2 % OP SOLN
1.0000 [drp] | Freq: Two times a day (BID) | OPHTHALMIC | Status: DC
  Administered 2017-12-28 – 2018-01-01 (×8): 1 [drp] via OPHTHALMIC
  Filled 2017-12-28: qty 5

## 2017-12-28 MED ORDER — BUSPIRONE HCL 5 MG PO TABS
5.0000 mg | ORAL_TABLET | Freq: Three times a day (TID) | ORAL | Status: DC
  Administered 2017-12-29 – 2017-12-30 (×4): 5 mg via ORAL
  Filled 2017-12-28 (×10): qty 1

## 2017-12-28 MED ORDER — POLYETHYLENE GLYCOL 3350 17 G PO PACK: 17.0000 g | PACK | Freq: Every day | ORAL | Status: DC | PRN

## 2017-12-28 MED ORDER — ORAL CARE MOUTH RINSE
15.0000 mL | Freq: Two times a day (BID) | OROMUCOSAL | Status: DC
  Administered 2017-12-29 – 2018-01-01 (×4): 15 mL via OROMUCOSAL

## 2017-12-28 MED ORDER — ALBUTEROL SULFATE (2.5 MG/3ML) 0.083% IN NEBU: 2.5000 mg | INHALATION_SOLUTION | RESPIRATORY_TRACT | Status: DC | PRN

## 2017-12-28 MED ORDER — LATANOPROST 0.005 % OP SOLN
1.0000 [drp] | Freq: Every day | OPHTHALMIC | Status: DC
  Administered 2017-12-28 – 2017-12-31 (×4): 1 [drp] via OPHTHALMIC
  Filled 2017-12-28: qty 2.5

## 2017-12-28 MED ORDER — ALPRAZOLAM 0.25 MG PO TABS
0.2500 mg | ORAL_TABLET | Freq: Two times a day (BID) | ORAL | Status: DC | PRN
  Administered 2017-12-30 (×3): 0.25 mg via ORAL
  Filled 2017-12-28 (×3): qty 1

## 2017-12-28 MED ORDER — METRONIDAZOLE IN NACL 5-0.79 MG/ML-% IV SOLN
500.0000 mg | Freq: Three times a day (TID) | INTRAVENOUS | Status: DC
  Administered 2017-12-28 – 2017-12-31 (×10): 500 mg via INTRAVENOUS
  Filled 2017-12-28 (×11): qty 100

## 2017-12-28 MED ORDER — PRAVASTATIN SODIUM 40 MG PO TABS
40.0000 mg | ORAL_TABLET | Freq: Every day | ORAL | Status: DC
  Administered 2017-12-29: 40 mg via ORAL
  Filled 2017-12-28: qty 1

## 2017-12-28 MED ORDER — BRIMONIDINE TARTRATE-TIMOLOL 0.2-0.5 % OP SOLN
1.0000 [drp] | Freq: Two times a day (BID) | OPHTHALMIC | Status: DC
  Filled 2017-12-28: qty 5

## 2017-12-28 MED ORDER — MIRTAZAPINE 7.5 MG PO TABS
7.5000 mg | ORAL_TABLET | Freq: Every day | ORAL | Status: DC
  Administered 2017-12-29 – 2017-12-31 (×3): 7.5 mg via ORAL
  Filled 2017-12-28 (×5): qty 1

## 2017-12-28 MED ORDER — ONDANSETRON HCL 4 MG/2ML IJ SOLN: 4.0000 mg | Freq: Four times a day (QID) | INTRAMUSCULAR | Status: DC | PRN

## 2017-12-28 MED ORDER — METOPROLOL TARTRATE 5 MG/5ML IV SOLN: 5.0000 mg | Freq: Four times a day (QID) | INTRAVENOUS | Status: DC | PRN

## 2017-12-28 MED ORDER — ACETAMINOPHEN 325 MG PO TABS
650.0000 mg | ORAL_TABLET | Freq: Four times a day (QID) | ORAL | Status: DC | PRN
  Filled 2017-12-28: qty 2

## 2017-12-28 NOTE — ED Provider Notes (Signed)
[facility] EMERGENCY DEPARTMENT Provider Note   CSN: [id number] Arrival date & time: 2018-01-09  1129     History   Chief Complaint Chief Complaint  Patient presents with  . Respiratory Distress    HPI Wanda Myers is a 84 y.o. female.  84 year old female with extensive past medical history including dementia, type 2 diabetes mellitus, atrial fibrillation, CKD who presents with respiratory distress and altered mental status.  Patient was brought in by EMS from her nursing facility where they called EMS because the patient was having respiratory distress.  EMS noted that she was 85% on 2 L nasal cannula, switched her to CPAP.  They gave her fentanyl in route for generalized pain, she does have reported history of kidney stones.  Patient has been repeatedly stating "I cannot breathe."  LEVEL 5 CAVEAT DUE TO DEMENTIA   The history is provided by the EMS personnel and the nursing home.    Past Medical History:  Diagnosis Date  . A-fib (HCC) 2002  . Thyroid disease     Patient Active Problem List   Diagnosis Date Noted  . Dementia 11/30/2016  . Memory loss 12/21/2015  . Vitamin D deficiency 09/09/2015  . Type 2 diabetes mellitus (HCC) 11/11/2014  . Generalized anxiety disorder 07/08/2014  . Trochanteric bursitis of right hip 05/19/2014  . Chronic renal insufficiency, stage III (moderate) (HCC) 05/15/2014  . Hypothyroidism (acquired) 05/14/2014  . Chronic atrial fibrillation (HCC) 05/14/2014  . Hyperlipidemia LDL goal <100 05/14/2014  . Overactive bladder 05/14/2014  . Essential hypertension, benign 05/14/2014  . Encounter for monitoring statin therapy 05/14/2014  . Right hip pain 05/14/2014    Past Surgical History:  Procedure Laterality Date  . APPENDECTOMY  1944  . INCONTINENCE SURGERY  2007  . KYPHOPLASTY  2013    OB History    No data available       Home Medications    Prior to Admission medications   Medication Sig Start Date End  Date Taking? Authorizing Provider  ALPRAZolam (XANAX) 0.25 MG tablet Take 1 tablet (0.25 mg total) by mouth 2 (two) times daily as needed. for anxiety 11/30/16   Breeback, Lesly Rubenstein L, PA-C  AMBULATORY NON FORMULARY MEDICATION Patient would benefit from a small light-weight wheelchair to aid in ambulation due to unstable balance. 10/10/16   Jomarie Longs, PA-C  aspirin EC 81 MG tablet Take 81 mg by mouth daily.    [provider]  levothyroxine (SYNTHROID, LEVOTHROID) 50 MCG tablet TAKE 1 TABLET BY MOUTH DAILY BEFORE BREAKFAST. 11/30/16   Breeback, Jade L, PA-C  metoprolol tartrate (LOPRESSOR) 25 MG tablet Take 1 tablet (25 mg total) by mouth 2 (two) times daily. 11/30/16   Breeback, Lonna Cobb, PA-C  Multiple Vitamin (MULTIVITAMIN) tablet Take 1 tablet by mouth daily.    [provider]  pravastatin (PRAVACHOL) 40 MG tablet TAKE 1 TABLET(40 MG) BY MOUTH DAILY 11/30/16   Jomarie Longs, PA-C    Family History Family History  Problem Relation Age of Onset  . Heart attack Father   . Hyperlipidemia Father   . Hypertension Father     Social History Social History   Tobacco Use  . Smoking status: Never Smoker  Substance Use Topics  . Alcohol use: No  . Drug use: Not on file     Allergies   Sulfa antibiotics   Review of Systems Review of Systems  Unable to perform ROS: Dementia     Physical Exam Updated Vital  Signs BP (!) 81/66   Pulse (!) 41   Temp (!) 102.2 F (39 C) (Rectal)   Resp (!) 30   SpO2 95%   Physical Exam  Constitutional: She appears well-developed. She appears distressed.  Frail, elderly woman in respiratory distress, on CPAP  HENT:  Head: Normocephalic and atraumatic.  dry mucous membranes and cracked lips  Eyes: Conjunctivae are normal. Pupils are equal, round, and reactive to light.  Neck: Neck supple.  Cardiovascular: Normal rate and normal heart sounds. An irregularly irregular rhythm present.  Pulmonary/Chest: Accessory muscle usage  present. Tachypnea noted. She is in respiratory distress. She has rales.  Abdominal: Soft. Bowel sounds are normal. She exhibits no distension. There is no tenderness.  Musculoskeletal: She exhibits no edema.  Neurological: She is alert.  disoriented  Skin: Skin is warm and dry.  Scattered old ecchymoses L arm  Nursing note and vitals reviewed.    ED Treatments / Results  Labs (all labs ordered are listed, but only abnormal results are displayed) Labs Reviewed  URINE CULTURE  CULTURE, BLOOD (ROUTINE X 2)  CULTURE, BLOOD (ROUTINE X 2)  RESPIRATORY PANEL BY PCR  COMPREHENSIVE METABOLIC PANEL  LIPASE, BLOOD  CBC WITH DIFFERENTIAL/PLATELET  BRAIN NATRIURETIC PEPTIDE  URINALYSIS, ROUTINE W REFLEX MICROSCOPIC  I-STAT CHEM 8, ED  I-STAT CG4 LACTIC ACID, ED  I-STAT TROPONIN, ED  I-STAT VENOUS BLOOD GAS, ED    EKG  EKG Interpretation  Date/Time:  Thursday December 28 2017 11:42:41 EST Ventricular Rate:  139 PR Interval:    QRS Duration: 84 QT Interval:  315 QTC Calculation: 479 R Axis:   140 Text Interpretation:  Atrial fibrillation with rapid V-rate Probable anterolateral infarct, age indeterm No previous ECGs available Confirmed by Frederick Peers [phone]) on 01/15/2018 12:01:51 PM       Radiology No results found.  Procedures .Critical Care Performed by: Laurence Spates, MD Authorized by: Laurence Spates, MD   Critical care provider statement:    Critical care time (minutes):  30   Critical care time was exclusive of:  Separately billable procedures and treating other patients   Critical care was necessary to treat or prevent imminent or life-threatening deterioration of the following conditions:  Respiratory failure and sepsis   Critical care was time spent personally by me on the following activities:  Development of treatment plan with patient or surrogate, evaluation of patient's response to treatment, examination of patient, obtaining history from  patient or surrogate, ordering and performing treatments and interventions, ordering and review of laboratory studies, ordering and review of radiographic studies and re-evaluation of patient's condition   (including critical care time)  Medications Ordered in ED Medications  sodium chloride 0.9 % bolus 1,000 mL (not administered)    And  sodium chloride 0.9 % bolus 1,000 mL (not administered)    And  sodium chloride 0.9 % bolus 250 mL (not administered)  ceFEPIme (MAXIPIME) 2 g in dextrose 5 % 50 mL IVPB (not administered)  vancomycin (VANCOCIN) IVPB 1000 mg/200 mL premix (not administered)  acetaminophen (TYLENOL) suppository 650 mg (not administered)     Initial Impression / Assessment and Plan / ED Course  I have reviewed the triage vital signs and the nursing notes.  Pertinent labs & imaging results that were available during my care of the patient were reviewed by me and considered in my medical decision making (see chart for details).     Pt arrived in respiratory distress, placed on bipap. Rales b/l. T  102.2, BP 81/66, A fib on EKG. Initiated code sepsis w/ blood and urine cultures, IV fluids, Tylenol, vancomycin, and cefepime.  Lab work notable for initial lactate of 10, venous pH 7.20 with CO2 37, troponin 2.5, creatinine 1.95, anion gap 22, CBC 12.2.  Chest x-ray shows right upper lobe pneumonia.  Patient's hypotension improved after fluid boluses.  I contacted the patient's daughter by phone and discussed her very concerning lab work and the life-threatening nature of her respiratory failure.  Daughter has confirmed that the patient is DNR/DNI therefore we will maintain her on BiPAP with aggressive medical therapy for now.  Discussed admission with [facility], Dr. Clearnce SorrelPurohit, and pt admitted for further treatment.  Final Clinical Impressions(s) / ED Diagnoses   Final diagnoses:  Acute respiratory failure with hypoxia (HCC)  Healthcare-associated pneumonia  Septic shock (HCC)    NSTEMI (non-ST elevated myocardial infarction) [facility](HCC)  Atrial fibrillation with RVR [facility](HCC)    ED Discharge Orders    None       Yarely Bebee, Ambrose Finlandachel Morgan, MD 06-05-18 1643

## 2017-12-28 NOTE — Progress Notes (Signed)
Patient transported on the BiPAP to 2C07. Vitals stable.

## 2017-12-28 NOTE — ED Triage Notes (Signed)
Patient arrived via [facility] from [facility] on [address].  EMS called by staff because patient "not breathing well" Patient arrived on a CPAP, response to pain.

## 2017-12-28 NOTE — H&P (Signed)
History and Physical    Wanda Myers ZMO:[id number] DOB: 1923-01-18 DOA: 01/02/2018  PCP: Donella Stade, PA-C   Patient coming from: Memory care unit, skilled nursing facility  Chief Complaint: [place]  HPI: Wanda Myers is a 84 y.o. female with medical history significant of chronic atrial fibrillation not on anticoagulation, dementia, hypertension, hypothyroidism who comes in from skilled nursing facility with worsening hypoxia, fever all consistent with sepsis from pneumonia.  Most of history is obtained from ED physician, chart, patient's family. Per the daughter patient was in her usual state of health at the nursing facility until yesterday when she was called that she had an episode of nausea vomiting.  She was given oral Zofran with apparent resolution of her symptoms.  She received a abdominal x-ray that reportedly showed nephrolithiasis however no acute process.  Today patient was noted to have increased work of breathing.  Pulse oximetry was checked and noted to be hypoxic.  Patient was placed on 2 L and was sent to the ED.  Patient only reports that she does not have pain anywhere.  She reports only feeling cold.  She cannot participate in any further review of systems due to her dementia.  ED Course: In the ED patient was noted to be febrile to 102.2.  Her heart rate was noted to be tachycardic to the 140s, respirations were in the 30s-40s, and her blood pressure was 81/66.  She met all the criteria for severe sepsis secondary to pulmonary source.  Patient was given sepsis bolus fluids, and started on IV antibiotics of cefepime and vancomycin.  Her labs were notable for a white count of 12.2 with a left shift.  Creatinine 1.6 with that appeared to be new.  Troponin was noted to be 2.48.  Lactic acid was 10.4.  VBG showed a metabolic acidosis with some respiratory compensation.  Patient was placed on BiPAP.  Chest x-ray showed right upper lobe pneumonia.  Review of Systems: As per  HPI otherwise 10 point review of systems negative.   Past Medical History:  Diagnosis Date  . A-fib ([place]) 2002  . Dementia   . Hypertension   . Thyroid disease     Past Surgical History:  Procedure Laterality Date  . APPENDECTOMY  1944  . INCONTINENCE SURGERY  2007  . KYPHOPLASTY  2013     reports that  has never smoked. she has never used smokeless tobacco. She reports that she does not drink alcohol or use drugs.  Allergies  Allergen Reactions  . Sulfa Antibiotics     Family History  Problem Relation Age of Onset  . Heart attack Father   . Hyperlipidemia Father   . Hypertension Father     Prior to Admission medications   Medication Sig Start Date End Date Taking? Authorizing Provider  acetaminophen (TYLENOL) 500 MG tablet Take 1,000 mg by mouth 2 (two) times daily.   Yes [provider]  ALPRAZolam (XANAX) 0.25 MG tablet Take 1 tablet (0.25 mg total) by mouth 2 (two) times daily as needed. for anxiety 11/30/16  Yes Breeback, Jade L, PA-C  aspirin EC 81 MG tablet Take 81 mg by mouth daily.   Yes [provider]  brimonidine-timolol (COMBIGAN) 0.2-0.5 % ophthalmic solution Place 1 drop into both eyes as needed for dry eyes.   Yes [provider]  busPIRone (BUSPAR) 5 MG tablet Take 5 mg by mouth 3 (three) times daily.   Yes [provider]  HYDROcodone-acetaminophen (NORCO/VICODIN) 5-325 MG tablet  Take 1 tablet by mouth every 4 (four) hours as needed for moderate pain.   Yes [provider]  latanoprost (XALATAN) 0.005 % ophthalmic solution Place 1 drop into both eyes at bedtime.   Yes [provider]  levothyroxine (SYNTHROID, LEVOTHROID) 50 MCG tablet TAKE 1 TABLET BY MOUTH DAILY BEFORE BREAKFAST. Patient taking differently: Take 50 mcg by mouth daily before breakfast.  11/30/16  Yes Breeback, Jade L, PA-C  metoprolol tartrate (LOPRESSOR) 25 MG tablet Take 1 tablet (25 mg total) by mouth 2 (two) times daily. 11/30/16  Yes  Breeback, Jade L, PA-C  mirtazapine (REMERON) 7.5 MG tablet Take 7.5 mg by mouth at bedtime.   Yes [provider]  Multiple Vitamin (MULTIVITAMIN WITH MINERALS) TABS tablet Take 1 tablet by mouth daily.   Yes [provider]  ondansetron (ZOFRAN) 4 MG tablet Take 4 mg by mouth every 6 (six) hours as needed for nausea/vomiting. 12/27/17  Yes [provider]  polyethylene glycol (MIRALAX / GLYCOLAX) packet Take 17 g by mouth daily as needed for mild constipation.   Yes [provider]  pravastatin (PRAVACHOL) 40 MG tablet TAKE 1 TABLET(40 MG) BY MOUTH DAILY 11/30/16  Yes Breeback, Jade L, PA-C  sertraline (ZOLOFT) 50 MG tablet Take 50 mg by mouth every morning. 12/11/17  Yes [provider]  Skin Protectants, Misc. (EUCERIN) cream Apply 1 application topically every 8 (eight) hours as needed (itching).   Yes [provider]  AMBULATORY NON [place] Patient would benefit from a small light-weight wheelchair to aid in ambulation due to unstable balance. 10/10/16   Donella Stade, PA-C    Physical Exam: Vitals:   01/14/2018 1200 12/23/2017 1230 01/07/2018 1245 01/08/2018 1300  BP: (!) 82/47 (!) 87/65 105/70 116/89  Pulse: (!) 132   67  Resp: (!) 31 17 (!) 21 (!) 27  Temp:      TempSrc:      SpO2: 98%  98% 100%  Weight:    71.7 kg (158 lb)  Height:    5' 4" (1.626 m)    Constitutional: NAD, calm, comfortable Vitals:   12/26/2017 1200 01/09/2018 1230 01/17/2018 1245 12/29/2017 1300  BP: (!) 82/47 (!) 87/65 105/70 116/89  Pulse: (!) 132   67  Resp: (!) 31 17 (!) 21 (!) 27  Temp:      TempSrc:      SpO2: 98%  98% 100%  Weight:    71.7 kg (158 lb)  Height:    5' 4" (1.626 m)   Eyes: Anicteric sclera ENMT: BiPAP in place, edentulous Neck: normal, supple Respiratory: Mildly increased work of breathing, BiPAP in place, scattered rhonchi throughout, no wheezing Cardiovascular: Tachycardia, irregularly irregular rhythm, no murmurs, distant  heart sounds, Abdomen: no tenderness, no masses palpated. No hepatosplenomegaly. Bowel sounds positive.  Musculoskeletal: No lower extremity edema Skin: Over left iliac crest noted to have abrasion Neurologic: Grossly intact, patient responds to most questions only by reporting that she is cold, not alert or oriented Psychiatric: Not alert or oriented.    Labs on Admission: I have personally reviewed following labs and imaging studies  CBC: Recent Labs  Lab 12/23/2017 1137 01/02/2018 1214  WBC 12.2*  --   NEUTROABS 10.7*  --   HGB 12.3 12.9  HCT 40.0 38.0  MCV 94.6  --   PLT 178  --    Basic Metabolic Panel: Recent Labs  Lab 12/27/2017 1137 12/31/2017 1214  NA 139 140  K 3.6 3.5  CL 103 104  CO2 14*  --   GLUCOSE 245* 226*  BUN 31* 32*  CREATININE 1.95* 1.60*  CALCIUM 9.2  --    GFR: Estimated Creatinine Clearance: 21.3 mL/min (A) (by C-G formula based on SCr of 1.6 mg/dL (H)). Liver Function Tests: Recent Labs  Lab 12/22/2017 1137  AST 104*  ALT 49  ALKPHOS 59  BILITOT 1.2  PROT 6.6  ALBUMIN 3.4*   Recent Labs  Lab 01/02/2018 1137  LIPASE 24   No results for input(s): AMMONIA in the last 168 hours. Coagulation Profile: No results for input(s): INR, PROTIME in the last 168 hours. Cardiac Enzymes: No results for input(s): CKTOTAL, CKMB, CKMBINDEX, TROPONINI in the last 168 hours. BNP (last 3 results) No results for input(s): PROBNP in the last 8760 hours. HbA1C: No results for input(s): HGBA1C in the last 72 hours. CBG: Recent Labs  Lab 01/13/2018 1259  GLUCAP 202*   Lipid Profile: No results for input(s): CHOL, HDL, LDLCALC, TRIG, CHOLHDL, LDLDIRECT in the last 72 hours. Thyroid Function Tests: No results for input(s): TSH, T4TOTAL, FREET4, T3FREE, THYROIDAB in the last 72 hours. Anemia Panel: No results for input(s): VITAMINB12, FOLATE, FERRITIN, TIBC, IRON, RETICCTPCT in the last 72 hours. Urine analysis:    Component Value Date/Time   COLORURINE DARK  YELLOW 12/21/2015 1613   APPEARANCEUR TURBID (A) 12/21/2015 1613   LABSPEC 1.026 12/21/2015 1613   PHURINE 5.5 12/21/2015 1613   GLUCOSEU NEGATIVE 12/21/2015 1613   HGBUR NEGATIVE 12/21/2015 1613   BILIRUBINUR neg 10/10/2016 [place] 12/21/2015 1613   PROTEINUR 30 10/10/2016 1347   PROTEINUR NEGATIVE 12/21/2015 1613   UROBILINOGEN 0.2 10/10/2016 1347   NITRITE neg 10/10/2016 1347   NITRITE NEGATIVE 12/21/2015 1613   LEUKOCYTESUR small (1+) (A) 10/10/2016 1347    Radiological Exams on Admission: Dg Chest Port 1 View  Result Date: 01/16/2018 CLINICAL DATA:  Encounter for respiratory distress and hypoxia. EXAM: PORTABLE CHEST 1 VIEW COMPARISON:  06/29/2016 FINDINGS: Airspace disease on the right at the apex primarily. Chronic cardiomegaly. Mediastinal contours are distorted by rotation. No visible cavitation or effusion. Artifact from EKG leads. IMPRESSION: 1. Right upper lobe pneumonia. Followup PA and lateral chest X-ray is recommended in 3-4 weeks following trial of antibiotic therapy to ensure resolution. 2. Chronic cardiomegaly. Electronically Signed   By: Monte Fantasia M.D.   On: 01/10/2018 12:13    EKG: Independently reviewed.  Atrial fibrillation with RVR, no acute ST segment changes, no old comparison  Assessment/Plan Principal Problem:   Sepsis due to pneumonia [facility]) Active Problems:   Hypothyroidism (acquired)   Chronic atrial fibrillation (HCC)   Essential hypertension, benign   Chronic renal insufficiency, stage III (moderate) (HCC)   Type 2 diabetes mellitus ([place])   Dementia   ##) Sepsis due to pneumonia: Differential diagnosis includes aspiration pneumonia, healthcare acquired pneumonia secondary to staying at facility.  Patient's family does not want intubation.  Her respiratory status is tenuous and her labs are dramatically poor however she clinically looks quite well.  Family would like to try medical therapy without intubation. - Continue IV  fluids gently at normal saline 75 mL's an hour -Continue cefepime, metronidazole, vancomycin.  This should provide coverage for both hospital-acquired organisms as well as hospital-acquired aspiration pneumonia. -Follow-up blood cultures obtained 01/09/2018 -Follow-up respiratory virus panel, flu panel - Will consider hydrocortisone stress dosing if patient becomes hypotensive again next line -admit to stepdown   ##) Acute kidney injury: Likely in the  setting of severe sepsis -Avoid nephrotoxins -IV fluids  ##) Chronic atrial fibrillation with rapid ventricular response: Patient noted to have intermittent episodes of RVR secondary to likely stress from sepsis. -Continue aspirin 81 mg -Continue IV metoprolol 5 mg every 4 hours as needed for heart rate greater than 130 - Continue p.o. metoprolol tartrate 85 mg twice daily  ##) Elevated troponin: Likely in the setting of sepsis this is not dramatically surprising.  Suspect that she had a type II non-ST segment elevated MI due to demand.  No ST segment changes concerning for ACS. -Monitor and trend -Obtain BNP -Obtain echo -Telemetry  ##) Hypothyroidism: -Continue levothyroxine 50 mcg daily  ##) psych: -Continue sertraline 50 mg daily -Continue alprazolam 0.25 mg twice daily as needed for anxiety -Continue BuSpar 5 mg 3 times daily -Continue mirtazapine 7.5 mg nightly  ##) Hyperlipidemia: -Continue pravastatin 40 mg at night  Fluids: IV fluids Electrolytes: Monitor and supplement Nutrition: N.p.o. while on BiPAP  Prophylaxis: Enoxaparin 30 mcg  Disposition: Pending improvement, family is willing to transition to comfort care patient progressively deteriorates  DNR  Cristy Folks MD [facility]  If 7PM-7AM, please contact night-coverage [email or web address] Password [email or web address]  12/26/2017, 1:35 PM

## 2017-12-28 NOTE — ED Notes (Signed)
Called bed placement for update.  Will pass information on to family members

## 2017-12-28 NOTE — ED Notes (Signed)
Per [facility] facility Nurse, patient was weighed om January 11, she was 158lbs

## 2017-12-28 NOTE — ED Notes (Signed)
Family updated.

## 2017-12-28 NOTE — ED Notes (Addendum)
Per facility, patient's family notified

## 2017-12-28 NOTE — ED Notes (Signed)
Call to 2C to give report 

## 2017-12-28 NOTE — ED Notes (Signed)
ED Provider at bedside. 

## 2017-12-28 NOTE — Progress Notes (Signed)
Pharmacy Antibiotic Note  Wanda Myers is a 84 y.o. female admitted on 12/27/2017 with sepsis.  Pharmacy has been consulted for vancomycin and cefepime dosing.  Plan: -vancomycin load 1G x 1 -vancomycin 750mg  every 24 hours -goal trough 15-20 mcg/mL  -cefepime 2G x1 -cefepime 1G every 24 hours  -monitor clinical progression, renal function, and trough as needed  Height: 5\' 4"  (162.6 cm) Weight: 158 lb (71.7 kg)(Weight from SNF ) IBW/kg (Calculated) : 54.7  Temp (24hrs), Avg:102.2 F (39 C), Min:102.2 F (39 C), Max:102.2 F (39 C)  Recent Labs  Lab Jun 21, 2018 1137 Jun 21, 2018 1214 Jun 21, 2018 1216  WBC 12.2*  --   --   CREATININE 1.95* 1.60*  --   LATICACIDVEN  --   --  10.04*    Estimated Creatinine Clearance: 21.3 mL/min (A) (by C-G formula based on SCr of 1.6 mg/dL (H)).    Allergies  Allergen Reactions  . Sulfa Antibiotics     Antimicrobials this admission: 2/7 vancomycin >>  2/7 cefepime >>   Dose adjustments this admission: N/A  Microbiology results: 2/7 BCx: pending 2/7 UCx: pending 2/7 Respiratory: pending   Thank you for allowing pharmacy to be a part of this patient's care.  Harlow AsaAmber C Tomi Paddock, PharmD 12/23/2017 1:25 PM

## 2017-12-29 ENCOUNTER — Inpatient Hospital Stay (HOSPITAL_COMMUNITY): Payer: Medicare Other

## 2017-12-29 DIAGNOSIS — J690 Pneumonitis due to inhalation of food and vomit: Secondary | ICD-10-CM

## 2017-12-29 DIAGNOSIS — F419 Anxiety disorder, unspecified: Secondary | ICD-10-CM

## 2017-12-29 DIAGNOSIS — E039 Hypothyroidism, unspecified: Secondary | ICD-10-CM

## 2017-12-29 DIAGNOSIS — N170 Acute kidney failure with tubular necrosis: Secondary | ICD-10-CM

## 2017-12-29 DIAGNOSIS — I361 Nonrheumatic tricuspid (valve) insufficiency: Secondary | ICD-10-CM

## 2017-12-29 DIAGNOSIS — J09X1 Influenza due to identified novel influenza A virus with pneumonia: Secondary | ICD-10-CM

## 2017-12-29 DIAGNOSIS — I340 Nonrheumatic mitral (valve) insufficiency: Secondary | ICD-10-CM

## 2017-12-29 DIAGNOSIS — E7849 Other hyperlipidemia: Secondary | ICD-10-CM

## 2017-12-29 DIAGNOSIS — N183 Chronic kidney disease, stage 3 (moderate): Secondary | ICD-10-CM

## 2017-12-29 DIAGNOSIS — F0151 Vascular dementia with behavioral disturbance: Secondary | ICD-10-CM

## 2017-12-29 LAB — ECHOCARDIOGRAM COMPLETE
Ao-asc: 30 cm
Area-P 1/2: 4.07 cm2
E decel time: 183 msec
FS: 37 % (ref 28–44)
Height: 64 in
IVS/LV PW RATIO, ED: 1.02
LA ID, A-P, ES: 50 mm
LA diam end sys: 50 mm
LA diam index: 2.75 cm/m2
LA vol A4C: 88.7 ml
LA vol index: 58.9 mL/m2
LA vol: 107 mL
LV PW d: 12.6 mm — AB (ref 0.6–1.1)
LVOT area: 2.54 cm2
LVOT diameter: 18 mm
MV Dec: 183
MV Peak grad: 3 mmHg
MV pk A vel: 37.3 m/s
MV pk E vel: 86.3 m/s
P 1/2 time: 54 ms
PISA EROA: 0.15 cm2
PV Reg vel dias: 104 cm/s
Reg peak vel: 350 cm/s
TAPSE: 10.6 mm
TR max vel: 350 cm/s
VTI: 125 cm
Weight: 2528 [oz_av]

## 2017-12-29 LAB — CBC
HCT: 34.1 % — ABNORMAL LOW (ref 36.0–46.0)
Hemoglobin: 10.8 g/dL — ABNORMAL LOW (ref 12.0–15.0)
MCH: 29.3 pg (ref 26.0–34.0)
MCHC: 31.7 g/dL (ref 30.0–36.0)
MCV: 92.4 fL (ref 78.0–100.0)
Platelets: 119 K/uL — ABNORMAL LOW (ref 150–400)
RBC: 3.69 MIL/uL — ABNORMAL LOW (ref 3.87–5.11)
RDW: 14.1 % (ref 11.5–15.5)
WBC: 8.9 K/uL (ref 4.0–10.5)

## 2017-12-29 LAB — BASIC METABOLIC PANEL
Anion gap: 11 (ref 5–15)
CO2: 19 mmol/L — ABNORMAL LOW (ref 22–32)
Chloride: 110 mmol/L (ref 101–111)
Creatinine, Ser: 1.93 mg/dL — ABNORMAL HIGH (ref 0.44–1.00)
GFR calc Af Amer: 25 mL/min — ABNORMAL LOW (ref >60)
Potassium: 3.7 mmol/L (ref 3.5–5.1)

## 2017-12-29 LAB — RESPIRATORY PANEL BY PCR
Adenovirus: NOT DETECTED
Bordetella pertussis: NOT DETECTED
Chlamydophila pneumoniae: NOT DETECTED
Coronavirus 229E: NOT DETECTED
Coronavirus HKU1: NOT DETECTED
Coronavirus NL63: NOT DETECTED
Coronavirus OC43: NOT DETECTED
Influenza A H1 2009: NOT DETECTED
Influenza A H1: NOT DETECTED
Influenza A H3: DETECTED — AB
Influenza A: NOT DETECTED
Influenza B: NOT DETECTED
Metapneumovirus: NOT DETECTED
Mycoplasma pneumoniae: NOT DETECTED
Parainfluenza Virus 1: NOT DETECTED
Parainfluenza Virus 2: NOT DETECTED
Parainfluenza Virus 3: NOT DETECTED
Parainfluenza Virus 4: NOT DETECTED
Respiratory Syncytial Virus: NOT DETECTED
Rhinovirus / Enterovirus: NOT DETECTED

## 2017-12-29 LAB — TROPONIN I
TROPONIN I: 1.98 ng/mL — AB (ref <0.03)
Troponin I: 1.49 ng/mL (ref <0.03)

## 2017-12-29 LAB — TSH: TSH: 0.892 u[IU]/mL (ref 0.350–4.500)

## 2017-12-29 LAB — BASIC METABOLIC PANEL WITH GFR
BUN: 40 mg/dL — ABNORMAL HIGH (ref 6–20)
Calcium: 7.9 mg/dL — ABNORMAL LOW (ref 8.9–10.3)
GFR calc non Af Amer: 21 mL/min — ABNORMAL LOW (ref >60)
Glucose, Bld: 150 mg/dL — ABNORMAL HIGH (ref 65–99)
Sodium: 140 mmol/L (ref 135–145)

## 2017-12-29 LAB — URINE CULTURE: Culture: NO GROWTH

## 2017-12-29 MED ORDER — IPRATROPIUM-ALBUTEROL 0.5-2.5 (3) MG/3ML IN SOLN
3.0000 mL | Freq: Four times a day (QID) | RESPIRATORY_TRACT | Status: DC
  Administered 2017-12-29 (×2): 3 mL via RESPIRATORY_TRACT
  Filled 2017-12-29 (×2): qty 3

## 2017-12-29 MED ORDER — DM-GUAIFENESIN ER 30-600 MG PO TB12
1.0000 | ORAL_TABLET | Freq: Two times a day (BID) | ORAL | Status: DC
  Administered 2017-12-29 – 2017-12-31 (×5): 1 via ORAL
  Filled 2017-12-29 (×6): qty 1

## 2017-12-29 MED ORDER — IPRATROPIUM-ALBUTEROL 0.5-2.5 (3) MG/3ML IN SOLN: 3.0000 mL | Freq: Four times a day (QID) | RESPIRATORY_TRACT | Status: DC

## 2017-12-29 MED ORDER — NITROGLYCERIN 0.4 MG SL SUBL: 0.4000 mg | SUBLINGUAL_TABLET | SUBLINGUAL | Status: DC | PRN

## 2017-12-29 MED ORDER — ALBUTEROL SULFATE (2.5 MG/3ML) 0.083% IN NEBU
2.5000 mg | INHALATION_SOLUTION | Freq: Three times a day (TID) | RESPIRATORY_TRACT | Status: DC
  Administered 2017-12-30 – 2018-01-01 (×7): 2.5 mg via RESPIRATORY_TRACT
  Filled 2017-12-29 (×7): qty 3

## 2017-12-29 MED ORDER — ALBUTEROL SULFATE (2.5 MG/3ML) 0.083% IN NEBU: 2.5000 mg | INHALATION_SOLUTION | RESPIRATORY_TRACT | Status: DC | PRN

## 2017-12-29 MED ORDER — OSELTAMIVIR PHOSPHATE 30 MG PO CAPS
30.0000 mg | ORAL_CAPSULE | Freq: Every day | ORAL | Status: DC
  Administered 2017-12-29 – 2017-12-30 (×2): 30 mg via ORAL
  Filled 2017-12-29 (×4): qty 1

## 2017-12-29 MED ORDER — METHYLPREDNISOLONE SODIUM SUCC 125 MG IJ SOLR
60.0000 mg | INTRAMUSCULAR | Status: DC
  Administered 2017-12-29 – 2017-12-30 (×2): 60 mg via INTRAVENOUS
  Filled 2017-12-29 (×2): qty 2

## 2017-12-29 NOTE — Progress Notes (Signed)
  Echocardiogram 2D Echocardiogram has been performed.  Kern Gingras G Xzavior Reinig 12/29/2017, 12:00 PM

## 2017-12-29 NOTE — Progress Notes (Addendum)
RT removed BIPAP and placed pt on 4L [place] with no complications. Pt is awake. Pt tol well. Pt in no distress. RN notified

## 2017-12-29 NOTE — Progress Notes (Signed)
PROGRESS NOTE    Wanda Myers  ERD:[id number] DOB: [birth year] DOA: 12/27/2017 PCP: Donella Stade, PA-C   Brief Narrative:  84 y.o. WF PMHx Chronic Atrial Fibrillation not on anticoagulation, Dementia, HTN, Hypothyroidism   Comes in from skilled nursing facility with worsening hypoxia, fever all consistent with sepsis from pneumonia.  Most of history is obtained from ED physician, chart, patient's family. Per the daughter patient was in her usual state of health at the nursing facility until yesterday when she was called that she had an episode of nausea vomiting.  She was given oral Zofran with apparent resolution of her symptoms.  She received a abdominal x-ray that reportedly showed nephrolithiasis however no acute process.  Today patient was noted to have increased work of breathing.  Pulse oximetry was checked and noted to be hypoxic.  Patient was placed on 2 L and was sent to the ED.  Patient only reports that she does not have pain anywhere.  She reports only feeling cold.  She cannot participate in any further review of systems due to her dementia.   ED Course: In the ED patient was noted to be febrile to 102.2.  Her heart rate was noted to be tachycardic to the 140s, respirations were in the 30s-40s, and her blood pressure was 81/66.  She met all the criteria for severe sepsis secondary to pulmonary source.  Patient was given sepsis bolus fluids, and started on IV antibiotics of cefepime and vancomycin.  Her labs were notable for a white count of 12.2 with a left shift.  Creatinine 1.6 with that appeared to be new.  Troponin was noted to be 2.48.  Lactic acid was 10.4.  VBG showed a metabolic acidosis with some respiratory compensation.  Patient was placed on BiPAP.  Chest x-ray showed right upper lobe pneumonia.    Subjective: 2/8 A/O 1 (did not know where, when, why), negative CP, negative SOB, negative abdominal pain    Assessment & Plan:   Principal Problem:   Sepsis due to  pneumonia [facility]) Active Problems:   Hypothyroidism (acquired)   Chronic atrial fibrillation (HCC)   Essential hypertension, benign   Chronic renal insufficiency, stage III (moderate) (HCC)   Type 2 diabetes mellitus (HCC)   Dementia  Positive influenza A H3 Pneumonia/Aspiration pneumonia? -Continue current empiric antibiotics (bacterial superinfection?). Family believes aspirated. Given patient's frailty would complete seven-day antibiotics. -Complete course of Tamiflu -Solu-Medrol 60 mg daily  -DuoNeb QID -Swallow study bedside -Pan culture pending  NSTEMI/Elevated troponin -True MI vs demand ischemia -Suspect that she had a type II non-ST segment elevated MI due to demand.  No ST segment changes concerning for ACS. -ASA, NTG -BNP elevated , 1605, PCXR shows cardiomegaly. -Echocardiogram pending  A. fib with RVR chronic -Metoprolol 25 mg BID -Metoprolol IV PRN  Acute on CKD stage III? (Last recent baseline Cr 1.95) prior that Cr 1.09 in 09/2016 Recent Labs  Lab 01/06/2018 1137 01/15/2018 1214 12/29/17 0236  CREATININE 1.95* 1.60* 1.93*  -baseline -Avoid nephrotoxic medication  Hypothyroidism -Synthroid 50 g daily      HLD -Pravastatin 40 mg daily  Psych   -Continue sertraline 50 mg daily -Continue alprazolam 0.25 mg twice daily as needed for anxiety -Continue BuSpar 5 mg 3 times daily -Continue mirtazapine 7.5 mg nightly     DVT prophylaxis: Lovenox Code Status: DO NOT RESUSCITATE Family Communication: Family at bedside for discussion of plan care Disposition Plan: TBD   Consultants:  None  Procedures/Significant Events:  2/7  PCXR; -RUL pneumonia.- Chronic cardiomegaly. 2/8 Echocardiogram:- LVEF:  60% to 65%.   -Grade 2 diastolic dysfunction- Mitral valve:  mild to moderate regurgitation.- Left atrium:  severely dilated. -Right ventricle: mildly to moderately reduced with apical akinesis. -Tricuspid valve: moderate regurgitation. - Pericardium,  extracardiac: A trivial pericardial effusion     I have personally reviewed and interpreted all radiology studies and my findings are as above.  VENTILATOR SETTINGS: None    Cultures 2/7 respiratory virus panel positive influenza A H3 2/7 blood pending 2/7 urine pending 2/7 MRSA PCR negative    Antimicrobials: Anti-infectives (From admission, onward)   Start     Stop   12/29/17 1230  vancomycin (VANCOCIN) IVPB 750 mg/150 ml premix         12/29/17 1230  ceFEPIme (MAXIPIME) 1 g in dextrose 5 % 50 mL IVPB         12/30/2017 2200  ceFEPIme (MAXIPIME) 2 g in dextrose 5 % 50 mL IVPB  Status:  Discontinued     01/06/2018 1843   01/06/2018 1800  metroNIDAZOLE (FLAGYL) IVPB 500 mg         12/24/2017 1215  ceFEPIme (MAXIPIME) 2 g in dextrose 5 % 50 mL IVPB     01/15/2018 1304   01/04/2018 1215  vancomycin (VANCOCIN) IVPB 1000 mg/200 mL premix     01/06/2018 1413       Devices    LINES / TUBES:      Continuous Infusions: . sodium chloride 1,000 mL (01/04/2018 1801)  . ceFEPime (MAXIPIME) IV    . metronidazole Stopped (12/29/17 0240)  . vancomycin       Objective: Vitals:   01/10/2018 2333 12/29/17 0032 12/29/17 0318 12/29/17 0332  BP: 99/67   96/74  Pulse: 77 96 (!) 102 84  Resp: (!) 23 (!) 23 (!) 21 17  Temp: 97.7 F (36.5 C)   97.8 F (36.6 C)  TempSrc: Axillary   Axillary  SpO2: 100% 100% 100% 99%  Weight:      Height:        Intake/Output Summary (Last 24 hours) at 12/29/2017 5462 Last data filed at 12/29/2017 0400 Gross per 24 hour  Intake 3681.25 ml  Output -  Net 3681.25 ml   Filed Weights   01/10/2018 1300  Weight: 158 lb (71.7 kg)    Examination:  General:  A/O 1 (does not know where, when, why),No acute respiratory distress ENT: Negative Runny nose, negative gingival bleeding, extremely dry mucous membranes  Neck:  Negative scars, masses, torticollis, lymphadenopathy, JVD Lungs:  positive mild diffuse expiratory wheeze, negative crackles    Cardiovascular:  tachycardic, Regular rhythm without murmur gallop or rub normal S1 and S2 Abdomen: negative abdominal pain, nondistended, positive soft, bowel sounds, no rebound, no ascites, no appreciable mass Extremities: No significant cyanosis, clubbing, or edema bilateral lower extremities Skin: Negative rashes, lesions, ulcers Psychiatric:   unable to assess secondary to dementia.  Central nervous system:  Cranial nerves II through XII intact, tongue/uvula midline, all extremities muscle strength 5/5, sensation intact throughout,  negative dysarthria, negative expressive aphasia, negative receptive aphasia.  .     Data Reviewed: Care during the described time interval was provided by me .  I have reviewed this patient's available data, including medical history, events of note, physical examination, and all test results as part of my evaluation.   CBC: Recent Labs  Lab 01/05/2018 1137 01/03/2018 1214 12/29/17 0236  WBC 12.2*  --  8.9  NEUTROABS 10.7*  --   --  HGB 12.3 12.9 10.8*  HCT 40.0 38.0 34.1*  MCV 94.6  --  92.4  PLT 178  --  824*   Basic Metabolic Panel: Recent Labs  Lab 12/31/2017 1137 01/14/2018 1214 12/29/17 0236  NA 139 140 140  K 3.6 3.5 3.7  CL 103 104 110  CO2 14*  --  19*  GLUCOSE 245* 226* 150*  BUN 31* 32* 40*  CREATININE 1.95* 1.60* 1.93*  CALCIUM 9.2  --  7.9*   GFR: Estimated Creatinine Clearance: 17.7 mL/min (A) (by C-G formula based on SCr of 1.93 mg/dL (H)). Liver Function Tests: Recent Labs  Lab 12/27/2017 1137  AST 104*  ALT 49  ALKPHOS 59  BILITOT 1.2  PROT 6.6  ALBUMIN 3.4*   Recent Labs  Lab 12/27/2017 1137  LIPASE 24   No results for input(s): AMMONIA in the last 168 hours. Coagulation Profile: No results for input(s): INR, PROTIME in the last 168 hours. Cardiac Enzymes: No results for input(s): CKTOTAL, CKMB, CKMBINDEX, TROPONINI in the last 168 hours. BNP (last 3 results) No results for input(s): PROBNP in the last 8760  hours. HbA1C: No results for input(s): HGBA1C in the last 72 hours. CBG: Recent Labs  Lab 01/14/2018 1259  GLUCAP 202*   Lipid Profile: No results for input(s): CHOL, HDL, LDLCALC, TRIG, CHOLHDL, LDLDIRECT in the last 72 hours. Thyroid Function Tests: Recent Labs    12/24/2017 2303  TSH 0.892   Anemia Panel: No results for input(s): VITAMINB12, FOLATE, FERRITIN, TIBC, IRON, RETICCTPCT in the last 72 hours. Urine analysis:    Component Value Date/Time   COLORURINE AMBER (A) 01/15/2018 1407   APPEARANCEUR CLOUDY (A) 12/24/2017 1407   LABSPEC 1.023 01/03/2018 1407   PHURINE 5.0 12/29/2017 1407   GLUCOSEU 50 (A) 12/30/2017 1407   HGBUR NEGATIVE 01/02/2018 1407   BILIRUBINUR NEGATIVE 01/13/2018 1407   BILIRUBINUR neg 10/10/2016 1347   KETONESUR NEGATIVE 12/22/2017 1407   PROTEINUR 100 (A) 12/24/2017 1407   UROBILINOGEN 0.2 10/10/2016 1347   NITRITE NEGATIVE 12/29/2017 1407   LEUKOCYTESUR NEGATIVE 01/16/2018 1407   Sepsis Labs: _0 (procalcitonin:4,lacticidven:4)  ) Recent Results (from the past 240 hour(s))  MRSA PCR Screening     Status: None   Collection Time: 12/24/2017  8:13 PM  Result Value Ref Range Status   MRSA by PCR NEGATIVE NEGATIVE Final    Comment:        The GeneXpert MRSA Assay (FDA approved for NASAL specimens only), is one component of a comprehensive MRSA colonization surveillance program. It is not intended to diagnose MRSA infection nor to guide or monitor treatment for MRSA infections. Performed at [facility], [place] [address], [place], [place] [address]   Respiratory Panel by PCR     Status: Abnormal   Collection Time: 01/18/2018 10:02 PM  Result Value Ref Range Status   Adenovirus NOT DETECTED NOT DETECTED Final   Coronavirus 229E NOT DETECTED NOT DETECTED Final   Coronavirus HKU1 NOT DETECTED NOT DETECTED Final   Coronavirus NL63 NOT DETECTED NOT DETECTED Final   Coronavirus OC43 NOT DETECTED NOT DETECTED Final   Metapneumovirus  NOT DETECTED NOT DETECTED Final   Rhinovirus / Enterovirus NOT DETECTED NOT DETECTED Final   Influenza A NOT DETECTED NOT DETECTED Final   Influenza A H1 NOT DETECTED NOT DETECTED Final   Influenza A H1 2009 NOT DETECTED NOT DETECTED Final   Influenza A H3 DETECTED (A) NOT DETECTED Final   Influenza B NOT DETECTED NOT DETECTED Final  Parainfluenza Virus 1 NOT DETECTED NOT DETECTED Final   Parainfluenza Virus 2 NOT DETECTED NOT DETECTED Final   Parainfluenza Virus 3 NOT DETECTED NOT DETECTED Final   Parainfluenza Virus 4 NOT DETECTED NOT DETECTED Final   Respiratory Syncytial Virus NOT DETECTED NOT DETECTED Final   Bordetella pertussis NOT DETECTED NOT DETECTED Final   Chlamydophila pneumoniae NOT DETECTED NOT DETECTED Final   Mycoplasma pneumoniae NOT DETECTED NOT DETECTED Final         Radiology Studies: Dg Chest Port 1 View  Result Date: 01/11/2018 CLINICAL DATA:  Encounter for respiratory distress and hypoxia. EXAM: PORTABLE CHEST 1 VIEW COMPARISON:  06/29/2016 FINDINGS: Airspace disease on the right at the apex primarily. Chronic cardiomegaly. Mediastinal contours are distorted by rotation. No visible cavitation or effusion. Artifact from EKG leads. IMPRESSION: 1. Right upper lobe pneumonia. Followup PA and lateral chest X-ray is recommended in 3-4 weeks following trial of antibiotic therapy to ensure resolution. 2. Chronic cardiomegaly. Electronically Signed   By: Monte Fantasia M.D.   On: 01/04/2018 12:13        Scheduled Meds: . aspirin EC  81 mg Oral Daily  . brimonidine  1 drop Both Eyes Q12H   And  . timolol  1 drop Both Eyes Q12H  . busPIRone  5 mg Oral TID  . chlorhexidine  15 mL Mouth Rinse BID  . enoxaparin (LOVENOX) injection  30 mg Subcutaneous Q24H  . latanoprost  1 drop Both Eyes QHS  . levothyroxine  50 mcg Oral QAC breakfast  . mouth rinse  15 mL Mouth Rinse q12n4p  . metoprolol tartrate  25 mg Oral BID  . mirtazapine  7.5 mg Oral QHS  . multivitamin  with minerals  1 tablet Oral Daily  . pravastatin  40 mg Oral q1800  . sertraline  50 mg Oral BH-q7a   Continuous Infusions: . sodium chloride 1,000 mL (01/04/2018 1801)  . ceFEPime (MAXIPIME) IV    . metronidazole Stopped (12/29/17 0240)  . vancomycin       LOS: 1 day    Time spent: 40 minutes    Shinita Mac, Geraldo Docker, MD [facility] Pager [phone]   If 7PM-7AM, please contact night-coverage [email or web address] Password [facility] 12/29/2017, 7:42 AM

## 2017-12-29 NOTE — Progress Notes (Signed)
Pt's family member said that pt c/o chest pain during echocardiogram. Checked pt and HR went up to 120's-130's. Dr. Joseph ArtWoods notified this matter, checked stat EKG but pt mentioned pain on every where. No NTG given for chest pain (Pt's daughter also agreed with this nurse). Pt's urine output was nothing through the night, bladder scan showed less than 100 at night. Pt had wet brief and very small urine output with purewick. Bladder scan showed no urine in the bladder. HS McDonald's CorporationLee RN

## 2017-12-30 ENCOUNTER — Inpatient Hospital Stay (HOSPITAL_COMMUNITY): Payer: Medicare Other

## 2017-12-30 DIAGNOSIS — I214 Non-ST elevation (NSTEMI) myocardial infarction: Secondary | ICD-10-CM

## 2017-12-30 DIAGNOSIS — J9601 Acute respiratory failure with hypoxia: Secondary | ICD-10-CM

## 2017-12-30 DIAGNOSIS — I10 Essential (primary) hypertension: Secondary | ICD-10-CM

## 2017-12-30 DIAGNOSIS — I4891 Unspecified atrial fibrillation: Secondary | ICD-10-CM

## 2017-12-30 LAB — CBC
HCT: 36 % (ref 36.0–46.0)
Hemoglobin: 11.2 g/dL — ABNORMAL LOW (ref 12.0–15.0)
MCH: 28.9 pg (ref 26.0–34.0)
MCHC: 31.1 g/dL (ref 30.0–36.0)
MCV: 93 fL (ref 78.0–100.0)
PLATELETS: 132 10*3/uL — AB (ref 150–400)
RBC: 3.87 MIL/uL (ref 3.87–5.11)
RDW: 14.2 % (ref 11.5–15.5)
WBC: 8.8 10*3/uL (ref 4.0–10.5)

## 2017-12-30 LAB — TROPONIN I: Troponin I: 1.04 ng/mL (ref <0.03)

## 2017-12-30 LAB — BASIC METABOLIC PANEL
Anion gap: 13 (ref 5–15)
BUN: 53 mg/dL — AB (ref 6–20)
CALCIUM: 8.1 mg/dL — AB (ref 8.9–10.3)
CO2: 18 mmol/L — AB (ref 22–32)
Chloride: 108 mmol/L (ref 101–111)
Creatinine, Ser: 1.91 mg/dL — ABNORMAL HIGH (ref 0.44–1.00)
GFR calc Af Amer: 25 mL/min — ABNORMAL LOW (ref >60)
GFR, EST NON AFRICAN AMERICAN: 22 mL/min — AB (ref >60)
GLUCOSE: 184 mg/dL — AB (ref 65–99)
POTASSIUM: 4 mmol/L (ref 3.5–5.1)
Sodium: 139 mmol/L (ref 135–145)

## 2017-12-30 LAB — MAGNESIUM: MAGNESIUM: 1.9 mg/dL (ref 1.7–2.4)

## 2017-12-30 MED ORDER — TRAZODONE HCL 50 MG PO TABS
25.0000 mg | ORAL_TABLET | Freq: Every day | ORAL | Status: DC
  Administered 2017-12-30 – 2017-12-31 (×2): 25 mg via ORAL
  Filled 2017-12-30 (×2): qty 1

## 2017-12-30 NOTE — Clinical Social Work Note (Signed)
[facility]. CSW continuing to follow for disposition.   WoodruffBridget Maegen [place], [place] [phone]

## 2017-12-30 NOTE — Progress Notes (Signed)
PROGRESS NOTE    Wanda Myers  HEN:[id number] DOB: 25-Feb-1923 DOA: 12/29/2017 PCP: Donella Stade, PA-C   Brief Narrative:  84 y.o. WF PMHx Chronic Atrial Fibrillation not on anticoagulation, HTN, Dementia, Hypothyroidism   Comes in from skilled nursing facility with worsening hypoxia, fever all consistent with sepsis from pneumonia.  Most of history is obtained from ED physician, chart, patient's family. Per the daughter patient was in her usual state of health at the nursing facility until yesterday when she was called that she had an episode of nausea vomiting.  She was given oral Zofran with apparent resolution of her symptoms.  She received a abdominal x-ray that reportedly showed nephrolithiasis however no acute process.  Today patient was noted to have increased work of breathing.  Pulse oximetry was checked and noted to be hypoxic.  Patient was placed on 2 L and was sent to the ED.  Patient only reports that she does not have pain anywhere.  She reports only feeling cold.  She cannot participate in any further review of systems due to her dementia.   ED Course: In the ED patient was noted to be febrile to 102.2.  Her heart rate was noted to be tachycardic to the 140s, respirations were in the 30s-40s, and her blood pressure was 81/66.  She met all the criteria for severe sepsis secondary to pulmonary source.  Patient was given sepsis bolus fluids, and started on IV antibiotics of cefepime and vancomycin.  Her labs were notable for a white count of 12.2 with a left shift.  Creatinine 1.6 with that appeared to be new.  Troponin was noted to be 2.48.  Lactic acid was 10.4.  VBG showed a metabolic acidosis with some respiratory compensation.  Patient was placed on BiPAP.  Chest x-ray showed right upper lobe pneumonia.    Subjective: 2/9 A/O 0, does not follow commands or answer questions. Now in mittens attempting to pull out leads and lines.   Assessment & Plan:   Principal Problem:   Sepsis due to pneumonia [facility]) Active Problems:   Hypothyroidism (acquired)   Chronic atrial fibrillation (HCC)   Essential hypertension, benign   Chronic renal insufficiency, stage III (moderate) (HCC)   Type 2 diabetes mellitus (HCC)   Dementia  Positive influenza A H3 Pneumonia/Aspiration pneumonia RUL -Continue current empiric antibiotics (bacterial superinfection?). Family believes aspirated. Given patient's frailty would complete seven-day antibiotics. -Complete course of Tamiflu -Solu-Medrol 60 mg daily  -DuoNeb QID -2/8 passed bedside swallow -Pan culture NGTD/negative today  NSTEMI/Elevated troponin -True MI vs demand ischemia -Suspect that she had a type II non-ST segment elevated MI due to demand.  No ST segment changes concerning for ACS. -ASA, NTG -BNP elevated , 1605, PCXR shows cardiomegaly.  Acute Diastolic CHF -Echocardiogram: Consistent with diastolic CHF see results below -2/9 will consult cardiology but given patient's age and frailty do not believe patient candidate for cardiac catheterization.  A. fib with RVR chronic -Metoprolol 25 mg BID -Metoprolol IV PRN -Currently rate controlled  Acute on CKD stage III? (Last recent baseline Cr 1.95) prior that Cr 1.09 in 09/2016 Recent Labs  Lab 12/31/2017 1137 01/03/2018 1214 12/29/17 0236 12/30/17 0209  CREATININE 1.95* 1.60* 1.93* 1.91*  -most likely her baseline. -Continue to avoid nephrotoxic medication   Hypothyroidism -Synthroid 50 g daily     HLD -Pravastatin 40 mg daily -Lipid panel pending  Dementia -Patient appears more confused today; dementia vs hospital delirium -See psych  Anxiety  Psych   -  Continue sertraline 50 mg daily -Continue alprazolam 0.25 mg twice daily as needed for anxiety -Continue BuSpar 5 mg 3 times daily -Continue mirtazapine 7.5 mg nightly     DVT prophylaxis: Lovenox Code Status: DO NOT RESUSCITATE Family Communication: Family at bedside for discussion of plan  care Disposition Plan: TBD   Consultants:  None  Procedures/Significant Events:  2/7 PCXR; -RUL pneumonia.- Chronic cardiomegaly. 2/8 Echocardiogram:- LVEF:  60% to 65%.   -Grade 2 diastolic dysfunction- Mitral valve:  mild to moderate regurgitation.- Left atrium:  severely dilated. -Right ventricle: mildly to moderately reduced with apical akinesis. -Tricuspid valve: moderate regurgitation. - Pericardium, extracardiac: A trivial pericardial effusion     I have personally reviewed and interpreted all radiology studies and my findings are as above.  VENTILATOR SETTINGS: None    Cultures 2/7 respiratory virus panel positive influenza A H3 2/7 blood pending 2/7 urine pending 2/7 MRSA PCR negative    Antimicrobials: Anti-infectives (From admission, onward)   Start     Stop   12/29/17 1230  vancomycin (VANCOCIN) IVPB 750 mg/150 ml premix         12/29/17 1230  ceFEPIme (MAXIPIME) 1 g in dextrose 5 % 50 mL IVPB         12/29/2017 2200  ceFEPIme (MAXIPIME) 2 g in dextrose 5 % 50 mL IVPB  Status:  Discontinued     01/12/2018 1843   12/27/2017 1800  metroNIDAZOLE (FLAGYL) IVPB 500 mg         12/23/2017 1215  ceFEPIme (MAXIPIME) 2 g in dextrose 5 % 50 mL IVPB     01/11/2018 1304   12/27/2017 1215  vancomycin (VANCOCIN) IVPB 1000 mg/200 mL premix     01/06/2018 1413       Devices    LINES / TUBES:      Continuous Infusions: . sodium chloride 75 mL/hr at 12/30/17 0335  . ceFEPime (MAXIPIME) IV Stopped (12/29/17 1306)  . metronidazole Stopped (12/30/17 0242)  . vancomycin Stopped (12/29/17 1450)     Objective: Vitals:   12/29/17 2129 12/29/17 2249 12/29/17 2331 12/30/17 0413  BP:  102/73 105/67 105/76  Pulse:  94 71 83  Resp:   16 20  Temp:   97.7 F (36.5 C)   TempSrc:   Axillary   SpO2: 93%  93% 94%  Weight:      Height:        Intake/Output Summary (Last 24 hours) at 12/30/2017 0725 Last data filed at 12/30/2017 0000 Gross per 24 hour  Intake 1900 ml  Output -   Net 1900 ml   Filed Weights   12/27/2017 1300  Weight: 158 lb (71.7 kg)    Physical Exam:  General: A/O 0 No acute respiratory distress, cachectic Neck:  Negative scars, masses, torticollis, lymphadenopathy, JVD Lungs: right lung fields clear to auscultation, decreased breath sounds LUL. Mild expiratory wheeze, negative crackles  Cardiovascular: Irregular irregular rhythm and rate, without murmur gallop or rub normal S1 and S2 Abdomen: negative abdominal pain, nondistended, positive soft, bowel sounds, no rebound, no ascites, no appreciable mass Extremities: No significant cyanosis, clubbing, or edema bilateral lower extremities Skin: Negative rashes, lesions, ulcers Psychiatric:  Unable to evaluate secondary to AMS  Central nervous system:  Patient spontaneously moving all extremities, does not follow commands, unable to evaluate further secondary to AMS .     Data Reviewed: Care during the described time interval was provided by me .  I have reviewed this patient's available data, including medical  history, events of note, physical examination, and all test results as part of my evaluation.   CBC: Recent Labs  Lab 01/02/2018 1137 01/11/2018 1214 12/29/17 0236 12/30/17 0209  WBC 12.2*  --  8.9 8.8  NEUTROABS 10.7*  --   --   --   HGB 12.3 12.9 10.8* 11.2*  HCT 40.0 38.0 34.1* 36.0  MCV 94.6  --  92.4 93.0  PLT 178  --  119* 440*   Basic Metabolic Panel: Recent Labs  Lab 01/15/2018 1137 01/13/2018 1214 12/29/17 0236 12/30/17 0209  NA 139 140 140 139  K 3.6 3.5 3.7 4.0  CL 103 104 110 108  CO2 14*  --  19* 18*  GLUCOSE 245* 226* 150* 184*  BUN 31* 32* 40* 53*  CREATININE 1.95* 1.60* 1.93* 1.91*  CALCIUM 9.2  --  7.9* 8.1*  MG  --   --   --  1.9   GFR: Estimated Creatinine Clearance: 17.9 mL/min (A) (by C-G formula based on SCr of 1.91 mg/dL (H)). Liver Function Tests: Recent Labs  Lab 01/18/2018 1137  AST 104*  ALT 49  ALKPHOS 59  BILITOT 1.2  PROT 6.6  ALBUMIN  3.4*   Recent Labs  Lab 01/09/2018 1137  LIPASE 24   No results for input(s): AMMONIA in the last 168 hours. Coagulation Profile: No results for input(s): INR, PROTIME in the last 168 hours. Cardiac Enzymes: Recent Labs  Lab 12/29/17 1203 12/29/17 1956 12/30/17 0209  TROPONINI 1.98* 1.49* 1.04*   BNP (last 3 results) No results for input(s): PROBNP in the last 8760 hours. HbA1C: No results for input(s): HGBA1C in the last 72 hours. CBG: Recent Labs  Lab 01/12/2018 1259  GLUCAP 202*   Lipid Profile: No results for input(s): CHOL, HDL, LDLCALC, TRIG, CHOLHDL, LDLDIRECT in the last 72 hours. Thyroid Function Tests: Recent Labs    01/06/2018 2303  TSH 0.892   Anemia Panel: No results for input(s): VITAMINB12, FOLATE, FERRITIN, TIBC, IRON, RETICCTPCT in the last 72 hours. Urine analysis:    Component Value Date/Time   COLORURINE AMBER (A) 01/06/2018 1407   APPEARANCEUR CLOUDY (A) 12/26/2017 1407   LABSPEC 1.023 12/24/2017 1407   PHURINE 5.0 01/07/2018 1407   GLUCOSEU 50 (A) 01/02/2018 1407   HGBUR NEGATIVE 01/17/2018 1407   BILIRUBINUR NEGATIVE 01/18/2018 1407   BILIRUBINUR neg 10/10/2016 1347   KETONESUR NEGATIVE 01/02/2018 1407   PROTEINUR 100 (A) 12/22/2017 1407   UROBILINOGEN 0.2 10/10/2016 1347   NITRITE NEGATIVE 12/26/2017 1407   LEUKOCYTESUR NEGATIVE 01/08/2018 1407   Sepsis Labs: '@LABRCNTIP'$ (procalcitonin:4,lacticidven:4)  ) Recent Results (from the past 240 hour(s))  Culture, blood (routine x 2)     Status: None (Preliminary result)   Collection Time: 12/24/2017 11:37 AM  Result Value Ref Range Status   Specimen Description BLOOD WRIST  Final   Special Requests   Final    BOTTLES DRAWN AEROBIC AND ANAEROBIC Blood Culture adequate volume   Culture   Final    NO GROWTH < 24 HOURS Performed at [facility], [place] [address] [place], [place] [address]    Report Status PENDING  Incomplete  Culture, blood (routine x 2)     Status: None (Preliminary  result)   Collection Time: 01/16/2018 12:10 PM  Result Value Ref Range Status   Specimen Description BLOOD RIGHT ANTECUBITAL  Final   Special Requests   Final    BOTTLES DRAWN AEROBIC AND ANAEROBIC Blood Culture adequate volume   Culture  Final    NO GROWTH < 24 HOURS Performed at [facility], [place] [address] [place], [place] [address]    Report Status PENDING  Incomplete  Urine culture     Status: None   Collection Time: 12/29/2017  2:07 PM  Result Value Ref Range Status   Specimen Description URINE, CATHETERIZED  Final   Special Requests NONE  Final   Culture   Final    NO GROWTH Performed at [facility], [address], [place], [place] [address]    Report Status 12/29/2017 FINAL  Final  MRSA PCR Screening     Status: None   Collection Time: 12/23/2017  8:13 PM  Result Value Ref Range Status   MRSA by PCR NEGATIVE NEGATIVE Final    Comment:        The GeneXpert MRSA Assay (FDA approved for NASAL specimens only), is one component of a comprehensive MRSA colonization surveillance program. It is not intended to diagnose MRSA infection nor to guide or monitor treatment for MRSA infections. Performed at [facility], [place] [address] [place], [place] [address]   Respiratory Panel by PCR     Status: Abnormal   Collection Time: 01/14/2018 10:02 PM  Result Value Ref Range Status   Adenovirus NOT DETECTED NOT DETECTED Final   Coronavirus 229E NOT DETECTED NOT DETECTED Final   Coronavirus HKU1 NOT DETECTED NOT DETECTED Final   Coronavirus NL63 NOT DETECTED NOT DETECTED Final   Coronavirus OC43 NOT DETECTED NOT DETECTED Final   Metapneumovirus NOT DETECTED NOT DETECTED Final   Rhinovirus / Enterovirus NOT DETECTED NOT DETECTED Final   Influenza A NOT DETECTED NOT DETECTED Final   Influenza A H1 NOT DETECTED NOT DETECTED Final   Influenza A H1 2009 NOT DETECTED NOT DETECTED Final   Influenza A H3 DETECTED (A) NOT DETECTED Final   Influenza B NOT DETECTED NOT  DETECTED Final   Parainfluenza Virus 1 NOT DETECTED NOT DETECTED Final   Parainfluenza Virus 2 NOT DETECTED NOT DETECTED Final   Parainfluenza Virus 3 NOT DETECTED NOT DETECTED Final   Parainfluenza Virus 4 NOT DETECTED NOT DETECTED Final   Respiratory Syncytial Virus NOT DETECTED NOT DETECTED Final   Bordetella pertussis NOT DETECTED NOT DETECTED Final   Chlamydophila pneumoniae NOT DETECTED NOT DETECTED Final   Mycoplasma pneumoniae NOT DETECTED NOT DETECTED Final         Radiology Studies: Dg Chest Port 1 View  Result Date: 01/18/2018 CLINICAL DATA:  Encounter for respiratory distress and hypoxia. EXAM: PORTABLE CHEST 1 VIEW COMPARISON:  06/29/2016 FINDINGS: Airspace disease on the right at the apex primarily. Chronic cardiomegaly. Mediastinal contours are distorted by rotation. No visible cavitation or effusion. Artifact from EKG leads. IMPRESSION: 1. Right upper lobe pneumonia. Followup PA and lateral chest X-ray is recommended in 3-4 weeks following trial of antibiotic therapy to ensure resolution. 2. Chronic cardiomegaly. Electronically Signed   By: Monte Fantasia M.D.   On: 01/03/2018 12:13        Scheduled Meds: . albuterol  2.5 mg Nebulization TID  . aspirin EC  81 mg Oral Daily  . brimonidine  1 drop Both Eyes Q12H   And  . timolol  1 drop Both Eyes Q12H  . busPIRone  5 mg Oral TID  . chlorhexidine  15 mL Mouth Rinse BID  . dextromethorphan-guaiFENesin  1 tablet Oral BID  . enoxaparin (LOVENOX) injection  30 mg Subcutaneous Q24H  . latanoprost  1 drop  Both Eyes QHS  . levothyroxine  50 mcg Oral QAC breakfast  . mouth rinse  15 mL Mouth Rinse q12n4p  . methylPREDNISolone (SOLU-MEDROL) injection  60 mg Intravenous Q24H  . metoprolol tartrate  25 mg Oral BID  . mirtazapine  7.5 mg Oral QHS  . multivitamin with minerals  1 tablet Oral Daily  . oseltamivir  30 mg Oral Daily  . pravastatin  40 mg Oral q1800  . sertraline  50 mg Oral BH-q7a   Continuous Infusions: .  sodium chloride 75 mL/hr at 12/30/17 0335  . ceFEPime (MAXIPIME) IV Stopped (12/29/17 1306)  . metronidazole Stopped (12/30/17 0242)  . vancomycin Stopped (12/29/17 1450)     LOS: 2 days    Time spent: 40 minutes    Timya Trimmer, Geraldo Docker, MD [facility] Pager [phone]   If 7PM-7AM, please contact night-coverage [email or web address] Password [email or web address] 12/30/2017, 7:25 AM

## 2017-12-30 NOTE — Progress Notes (Signed)
Have attempted to give patient pills several times today and she keeps pushing them back out of her mouth with her tongue. She is very confused, very little comprehensible speech and mostly just moans at times. She has rested more today, but is still easily agitated.

## 2017-12-30 NOTE — Progress Notes (Signed)
Pt pulling at [place], leads, and O2 probe. Pt uncooperative with increased agitation. Placed pt in mittens. Will continue to monitor.

## 2017-12-31 DIAGNOSIS — I5031 Acute diastolic (congestive) heart failure: Secondary | ICD-10-CM

## 2017-12-31 DIAGNOSIS — F0150 Vascular dementia without behavioral disturbance: Secondary | ICD-10-CM

## 2017-12-31 DIAGNOSIS — R6521 Severe sepsis with septic shock: Secondary | ICD-10-CM

## 2017-12-31 LAB — LIPID PANEL
CHOL/HDL RATIO: 3.3 ratio
CHOLESTEROL: 96 mg/dL (ref 0–200)
HDL: 29 mg/dL — AB (ref >40)
LDL Cholesterol: 51 mg/dL (ref 0–99)
Triglycerides: 82 mg/dL (ref <150)
VLDL: 16 mg/dL (ref 0–40)

## 2017-12-31 LAB — CBC
HEMATOCRIT: 38.4 % (ref 36.0–46.0)
Hemoglobin: 12 g/dL (ref 12.0–15.0)
MCH: 29 pg (ref 26.0–34.0)
MCHC: 31.3 g/dL (ref 30.0–36.0)
MCV: 92.8 fL (ref 78.0–100.0)
Platelets: 189 10*3/uL (ref 150–400)
RBC: 4.14 MIL/uL (ref 3.87–5.11)
RDW: 14.2 % (ref 11.5–15.5)
WBC: 9.6 10*3/uL (ref 4.0–10.5)

## 2017-12-31 LAB — BASIC METABOLIC PANEL
Anion gap: 13 (ref 5–15)
BUN: 69 mg/dL — AB (ref 6–20)
CHLORIDE: 110 mmol/L (ref 101–111)
CO2: 17 mmol/L — AB (ref 22–32)
CREATININE: 2.05 mg/dL — AB (ref 0.44–1.00)
Calcium: 8.2 mg/dL — ABNORMAL LOW (ref 8.9–10.3)
GFR calc Af Amer: 23 mL/min — ABNORMAL LOW (ref >60)
GFR calc non Af Amer: 20 mL/min — ABNORMAL LOW (ref >60)
GLUCOSE: 253 mg/dL — AB (ref 65–99)
Potassium: 4.1 mmol/L (ref 3.5–5.1)
SODIUM: 140 mmol/L (ref 135–145)

## 2017-12-31 LAB — MAGNESIUM: Magnesium: 2.2 mg/dL (ref 1.7–2.4)

## 2017-12-31 LAB — GLUCOSE, CAPILLARY
Glucose-Capillary: 264 mg/dL — ABNORMAL HIGH (ref 65–99)
Glucose-Capillary: 212 mg/dL — ABNORMAL HIGH (ref 65–99)
Glucose-Capillary: 190 mg/dL — ABNORMAL HIGH (ref 65–99)

## 2017-12-31 MED ORDER — LEVOTHYROXINE SODIUM 100 MCG IV SOLR
25.0000 ug | INTRAVENOUS | Status: DC
  Administered 2017-12-31: 25 ug via INTRAVENOUS
  Filled 2017-12-31: qty 5

## 2017-12-31 MED ORDER — INSULIN ASPART 100 UNIT/ML ~~LOC~~ SOLN
0.0000 [IU] | SUBCUTANEOUS | Status: DC
  Administered 2017-12-31: 5 [IU] via SUBCUTANEOUS
  Administered 2017-12-31: 8 [IU] via SUBCUTANEOUS
  Administered 2017-12-31 – 2018-01-01 (×2): 3 [IU] via SUBCUTANEOUS
  Administered 2018-01-01: 2 [IU] via SUBCUTANEOUS

## 2017-12-31 MED ORDER — INSULIN ASPART 100 UNIT/ML ~~LOC~~ SOLN: 0.0000 [IU] | SUBCUTANEOUS | Status: DC

## 2017-12-31 MED ORDER — METHYLPREDNISOLONE SODIUM SUCC 40 MG IJ SOLR
30.0000 mg | INTRAMUSCULAR | Status: DC
  Administered 2018-01-01: 30 mg via INTRAVENOUS
  Filled 2017-12-31: qty 1

## 2017-12-31 MED ORDER — SODIUM CHLORIDE 0.9 % IV SOLN
INTRAVENOUS | Status: DC
  Administered 2017-12-31: 13:00:00 via INTRAVENOUS

## 2017-12-31 MED ORDER — LORAZEPAM 2 MG/ML IJ SOLN: 0.5000 mg | Freq: Three times a day (TID) | INTRAMUSCULAR | Status: DC | PRN

## 2017-12-31 MED ORDER — MORPHINE SULFATE (PF) 2 MG/ML IV SOLN
0.5000 mg | INTRAVENOUS | Status: DC | PRN
  Administered 2017-12-31 – 2018-01-01 (×3): 1 mg via INTRAVENOUS
  Filled 2017-12-31 (×3): qty 1

## 2017-12-31 MED ORDER — BUSPIRONE HCL 5 MG PO TABS
2.5000 mg | ORAL_TABLET | Freq: Three times a day (TID) | ORAL | Status: DC
  Administered 2017-12-31: 2.5 mg via ORAL
  Filled 2017-12-31 (×3): qty 0.5

## 2017-12-31 NOTE — Plan of Care (Signed)
Patient is slowly progressing toward care goals.  No longer on BiPAP.  Cognition/Dementia does not allow for her to be involved in education or health behavior/discharge planning.  Respiratory status is improving.  Patient is able to maintain sats mid to upper 90s on O2 @ 2L/Panacea.  Patient remains confused and agitated at times but improves w/calm approach.  Sundowns.  This RN able to get PO meds administered w/vanilla ice cream this evening.  Patient passed swallow study 2/8.  Will continue to monitor for progression toward care goals and discharge disposition.

## 2017-12-31 NOTE — Plan of Care (Signed)
Patient has not made any significant improvements overall today. Palliative care consulted and [facility].  Chest physiotherapy ordered; however, patient just screams throughout treatment and is unable to cough up secretions; order discontinued.  Will continue to monitor.

## 2017-12-31 NOTE — Progress Notes (Signed)
PROGRESS NOTE    Wanda Myers  WCB:[id number] DOB: 22-Jan-1923 DOA: 01/18/2018 PCP: Donella Stade, PA-C   Brief Narrative:  84 y.o. WF PMHx Chronic Atrial Fibrillation not on anticoagulation, HTN, Dementia, Hypothyroidism   Comes in from skilled nursing facility with worsening hypoxia, fever all consistent with sepsis from pneumonia.  Most of history is obtained from ED physician, chart, patient's family. Per the daughter patient was in her usual state of health at the nursing facility until yesterday when she was called that she had an episode of nausea vomiting.  She was given oral Zofran with apparent resolution of her symptoms.  She received a abdominal x-ray that reportedly showed nephrolithiasis however no acute process.  Today patient was noted to have increased work of breathing.  Pulse oximetry was checked and noted to be hypoxic.  Patient was placed on 2 L and was sent to the ED.  Patient only reports that she does not have pain anywhere.  She reports only feeling cold.  She cannot participate in any further review of systems due to her dementia.   ED Course: In the ED patient was noted to be febrile to 102.2.  Her heart rate was noted to be tachycardic to the 140s, respirations were in the 30s-40s, and her blood pressure was 81/66.  She met all the criteria for severe sepsis secondary to pulmonary source.  Patient was given sepsis bolus fluids, and started on IV antibiotics of cefepime and vancomycin.  Her labs were notable for a white count of 12.2 with a left shift.  Creatinine 1.6 with that appeared to be new.  Troponin was noted to be 2.48.  Lactic acid was 10.4.  VBG showed a metabolic acidosis with some respiratory compensation.  Patient was placed on BiPAP.  Chest x-ray showed right upper lobe pneumonia.    Subjective: 2/10 extremely sleepy but arousable follows some commands. A/O 0    Assessment & Plan:   Principal Problem:   Sepsis due to pneumonia [facility]) Active  Problems:   Hypothyroidism (acquired)   Chronic atrial fibrillation (HCC)   Essential hypertension, benign   Chronic renal insufficiency, stage III (moderate) (HCC)   Type 2 diabetes mellitus (HCC)   Dementia  Positive influenza A H3 Pneumonia/Aspiration pneumonia RUL -Continue current empiric antibiotics (bacterial superinfection?). Family believes aspirated. Given patient's frailty would complete seven-day antibiotics.  -Complete course of Tamiflu -2/10 Solu-Medrol 30 mg daily  -DuoNeb QID -2/8 passed bedside swallow -Pan culture NGTD/negative today -Physiotherapy vest + aggressive pulmonary toilet q 4 hr  -DC metronidazole and vancomycin. Cefepime will give you good anaerobic coverage for aspiration pneumonia, and MRSA by PCR negative  NSTEMI/Elevated troponin -True MI vs demand ischemia -Suspect that she had a type II non-ST segment elevated MI due to demand.  No ST segment changes concerning for ACS. -ASA, NTG -BNP elevated , 1605, PCXR shows cardiomegaly. -Does not appear to be currently having chest pain but difficult to elicit answer as patient is somnolent  Acute Diastolic CHF -Echocardiogram: Consistent with diastolic CHF see results below - decided on not consulting cardiology. Would not survive cardiac catheterization in her current condition. If patient makes significant medical recovery could consider consult and cardiology, however unlikely..  -Strict in and out since admission +7.2 L -Daily weight Filed Weights   12/26/2017 1300 12/31/17 2100  Weight: 158 lb (71.7 kg) 146 lb 6.2 oz (66.4 kg)   -Metoprolol 25 mg BID -Normal saline 35 ml/hr (appears dehydrated: Not taking in PO)  A. fib with RVR chronic -Metoprolol 25 mg BID -Metoprolol IV PRN -Currently rate controlled  Acute on CKD stage III? (Last recent baseline Cr 1.95) prior that Cr 1.09 in 09/2016 Recent Labs  Lab 12/23/2017 1137 01/04/2018 1214 12/29/17 0236 12/30/17 0209 12/31/17 0243  CREATININE  1.95* 1.60* 1.93* 1.91* 2.05*  -most likely her baseline. -Continue to avoid nephrotoxic medication   Hypothyroidism -2/10 Synthroid 50 g daily --> Synthroid IV 25 g daily    HLD -Pravastatin 40 mg daily -Lipid panel within AHA guidelines  Dementia -Patient extremely somnolent today initially stop/decreased significant amount of sedating medication. See goals of care  -See psych  Anxiety  Psych   -Continue sertraline 50 mg daily -2/10 decrease BuSpar 2.5 mg TID -Continue mirtazapine 7.5 mg nightly  Altered mental status -2/10 DC Xanax -2/10 Decrease BuSpar to 2.5 mg TID -DC Norco  Hyperglycemic  -2/10 Iatrogenic secondary to steroids: Decrease Medrol 30 mg daily -Moderate SSI  Goals of care -Spoke at length with RN Laneta Simmers HCPOA and Director PACU [facility]. After long discussion concerning patient's current status like to move toward comfort care. NP Romona Curls PALLIATIVE CARE also included in phone conversation and arranged for follow-up appointment on 2/11 to finalize goals of care.   ADDENDUM: -See goals of care -Morphine IV PRN -Ativan IV PRN     DVT prophylaxis: Lovenox Code Status: DO NOT RESUSCITATE Family Communication: See goals of care Disposition Plan: Comfort care vs hospice   Consultants:  None  Procedures/Significant Events:  2/7 PCXR; -RUL pneumonia.- Chronic cardiomegaly. 2/8 Echocardiogram:- LVEF:  60% to 65%.   -Grade 2 diastolic dysfunction- Mitral valve:  mild to moderate regurgitation.- Left atrium:  severely dilated. -Right ventricle: mildly to moderately reduced with apical akinesis. -Tricuspid valve: moderate regurgitation. - Pericardium, extracardiac: A trivial pericardial effusion     I have personally reviewed and interpreted all radiology studies and my findings are as above.  VENTILATOR SETTINGS: None    Cultures 2/7 respiratory virus panel positive influenza A H3 2/7 blood pending 2/7 urine  pending 2/7 MRSA PCR negative    Antimicrobials: Anti-infectives (From admission, onward)   Start     Stop   12/29/17 1430  oseltamivir (TAMIFLU) capsule 30 mg     01/03/18 0959   12/29/17 1230  vancomycin (VANCOCIN) IVPB 750 mg/150 ml premix  Status:  Discontinued     12/31/17 2150   12/29/17 1230  ceFEPIme (MAXIPIME) 1 g in dextrose 5 % 50 mL IVPB         12/27/2017 2200  ceFEPIme (MAXIPIME) 2 g in dextrose 5 % 50 mL IVPB  Status:  Discontinued     12/30/2017 1843   12/27/2017 1800  metroNIDAZOLE (FLAGYL) IVPB 500 mg  Status:  Discontinued     12/31/17 2150   01/14/2018 1215  ceFEPIme (MAXIPIME) 2 g in dextrose 5 % 50 mL IVPB     01/08/2018 1304   01/09/2018 1215  vancomycin (VANCOCIN) IVPB 1000 mg/200 mL premix     01/02/2018 1413       Devices    LINES / TUBES:      Continuous Infusions: . ceFEPime (MAXIPIME) IV Stopped (12/30/17 1338)  . metronidazole Stopped (12/31/17 0200)  . vancomycin Stopped (12/30/17 1605)     Objective: Vitals:   12/30/17 1948 12/30/17 2030 12/30/17 2312 12/31/17 0357  BP: 103/70  92/66 106/65  Pulse: 73   72  Resp: (!) 22  (!) 25 (!) 21  Temp: Marland Kitchen)  97.3 F (36.3 C)   (!) 97.5 F (36.4 C)  TempSrc: Axillary  Axillary Axillary  SpO2: 94% 93%  92%  Weight:      Height:        Intake/Output Summary (Last 24 hours) at 12/31/2017 0739 Last data filed at 12/30/2017 1500 Gross per 24 hour  Intake 700 ml  Output -  Net 700 ml   Filed Weights   01/18/2018 1300  Weight: 158 lb (71.7 kg)    Physical Exam:  General: A/O 0 sleepy difficult to arouse. No acute respiratory distress, cachectic Neck:  Negative scars, masses, torticollis, lymphadenopathy, JVD Lungs: diffuse rhonchi, negative wheezes or crackles Cardiovascular: Irregular irregular rhythm and rate, negative murmur gallop or rub normal S1 and S2 Abdomen: negative abdominal pain, nondistended, positive soft, bowel sounds, no rebound, no ascites, no appreciable mass Extremities: No  significant cyanosis, clubbing, or edema bilateral lower extremities Skin: Negative rashes, lesions, ulcers Psychiatric:  Unable to evaluate secondary to altered mental status  Central nervous system:  Cranial nerves II through XII intact, tongue/uvula midline, all extremities muscle strength 5/5, sensation intact throughout (withdraws to painful stimuli). Unable to fully evaluate secondary to altered mental status.   Data Reviewed: Care during the described time interval was provided by me .  I have reviewed this patient's available data, including medical history, events of note, physical examination, and all test results as part of my evaluation.   CBC: Recent Labs  Lab 01/15/2018 1137 01/05/2018 1214 12/29/17 0236 12/30/17 0209 12/31/17 0243  WBC 12.2*  --  8.9 8.8 9.6  NEUTROABS 10.7*  --   --   --   --   HGB 12.3 12.9 10.8* 11.2* 12.0  HCT 40.0 38.0 34.1* 36.0 38.4  MCV 94.6  --  92.4 93.0 92.8  PLT 178  --  119* 132* 161   Basic Metabolic Panel: Recent Labs  Lab 01/04/2018 1137 01/06/2018 1214 12/29/17 0236 12/30/17 0209 12/31/17 0243  NA 139 140 140 139 140  K 3.6 3.5 3.7 4.0 4.1  CL 103 104 110 108 110  CO2 14*  --  19* 18* 17*  GLUCOSE 245* 226* 150* 184* 253*  BUN 31* 32* 40* 53* 69*  CREATININE 1.95* 1.60* 1.93* 1.91* 2.05*  CALCIUM 9.2  --  7.9* 8.1* 8.2*  MG  --   --   --  1.9 2.2   GFR: Estimated Creatinine Clearance: 16.6 mL/min (A) (by C-G formula based on SCr of 2.05 mg/dL (H)). Liver Function Tests: Recent Labs  Lab 12/25/2017 1137  AST 104*  ALT 49  ALKPHOS 59  BILITOT 1.2  PROT 6.6  ALBUMIN 3.4*   Recent Labs  Lab 01/11/2018 1137  LIPASE 24   No results for input(s): AMMONIA in the last 168 hours. Coagulation Profile: No results for input(s): INR, PROTIME in the last 168 hours. Cardiac Enzymes: Recent Labs  Lab 12/29/17 1203 12/29/17 1956 12/30/17 0209  TROPONINI 1.98* 1.49* 1.04*   BNP (last 3 results) No results for input(s): PROBNP in  the last 8760 hours. HbA1C: No results for input(s): HGBA1C in the last 72 hours. CBG: Recent Labs  Lab 12/27/2017 1259  GLUCAP 202*   Lipid Profile: No results for input(s): CHOL, HDL, LDLCALC, TRIG, CHOLHDL, LDLDIRECT in the last 72 hours. Thyroid Function Tests: Recent Labs    12/31/2017 2303  TSH 0.892   Anemia Panel: No results for input(s): VITAMINB12, FOLATE, FERRITIN, TIBC, IRON, RETICCTPCT in the last 72 hours. Urine analysis:  Component Value Date/Time   COLORURINE AMBER (A) 01/03/2018 1407   APPEARANCEUR CLOUDY (A) 12/24/2017 1407   LABSPEC 1.023 01/09/2018 1407   PHURINE 5.0 12/30/2017 1407   GLUCOSEU 50 (A) 01/12/2018 1407   HGBUR NEGATIVE 01/10/2018 1407   BILIRUBINUR NEGATIVE 01/11/2018 1407   BILIRUBINUR neg 10/10/2016 1347   KETONESUR NEGATIVE 12/27/2017 1407   PROTEINUR 100 (A) 01/06/2018 1407   UROBILINOGEN 0.2 10/10/2016 1347   NITRITE NEGATIVE 01/14/2018 1407   LEUKOCYTESUR NEGATIVE 12/25/2017 1407   Sepsis Labs: _0 (procalcitonin:4,lacticidven:4)  ) Recent Results (from the past 240 hour(s))  Culture, blood (routine x 2)     Status: None (Preliminary result)   Collection Time: 01/11/2018 11:37 AM  Result Value Ref Range Status   Specimen Description BLOOD WRIST  Final   Special Requests   Final    BOTTLES DRAWN AEROBIC AND ANAEROBIC Blood Culture adequate volume   Culture   Final    NO GROWTH 2 DAYS Performed at [facility], [place] [address], [place], [place] [address]    Report Status PENDING  Incomplete  Culture, blood (routine x 2)     Status: None (Preliminary result)   Collection Time: 12/22/2017 12:10 PM  Result Value Ref Range Status   Specimen Description BLOOD RIGHT ANTECUBITAL  Final   Special Requests   Final    BOTTLES DRAWN AEROBIC AND ANAEROBIC Blood Culture adequate volume   Culture   Final    NO GROWTH 2 DAYS Performed at [facility], [place] [address], [place], [place] [address]    Report Status PENDING   Incomplete  Urine culture     Status: None   Collection Time: 01/17/2018  2:07 PM  Result Value Ref Range Status   Specimen Description URINE, CATHETERIZED  Final   Special Requests NONE  Final   Culture   Final    NO GROWTH Performed at [facility], [address], [place], [place] [address]    Report Status 12/29/2017 FINAL  Final  MRSA PCR Screening     Status: None   Collection Time: 01/10/2018  8:13 PM  Result Value Ref Range Status   MRSA by PCR NEGATIVE NEGATIVE Final    Comment:        The GeneXpert MRSA Assay (FDA approved for NASAL specimens only), is one component of a comprehensive MRSA colonization surveillance program. It is not intended to diagnose MRSA infection nor to guide or monitor treatment for MRSA infections. Performed at [facility], [place] [address] [place], [place] [address]   Respiratory Panel by PCR     Status: Abnormal   Collection Time: 12/31/2017 10:02 PM  Result Value Ref Range Status   Adenovirus NOT DETECTED NOT DETECTED Final   Coronavirus 229E NOT DETECTED NOT DETECTED Final   Coronavirus HKU1 NOT DETECTED NOT DETECTED Final   Coronavirus NL63 NOT DETECTED NOT DETECTED Final   Coronavirus OC43 NOT DETECTED NOT DETECTED Final   Metapneumovirus NOT DETECTED NOT DETECTED Final   Rhinovirus / Enterovirus NOT DETECTED NOT DETECTED Final   Influenza A NOT DETECTED NOT DETECTED Final   Influenza A H1 NOT DETECTED NOT DETECTED Final   Influenza A H1 2009 NOT DETECTED NOT DETECTED Final   Influenza A H3 DETECTED (A) NOT DETECTED Final   Influenza B NOT DETECTED NOT DETECTED Final   Parainfluenza Virus 1 NOT DETECTED NOT DETECTED Final   Parainfluenza Virus 2 NOT DETECTED NOT DETECTED Final   Parainfluenza  Virus 3 NOT DETECTED NOT DETECTED Final   Parainfluenza Virus 4 NOT DETECTED NOT DETECTED Final   Respiratory Syncytial Virus NOT DETECTED NOT DETECTED Final   Bordetella pertussis NOT DETECTED NOT DETECTED Final   Chlamydophila  pneumoniae NOT DETECTED NOT DETECTED Final   Mycoplasma pneumoniae NOT DETECTED NOT DETECTED Final         Radiology Studies: Dg Chest Port 1 View  Result Date: 12/30/2017 CLINICAL DATA:  Pneumonia EXAM: PORTABLE CHEST 1 VIEW COMPARISON:  Two days ago FINDINGS: Improved right upper lobe aeration but increased opacity at the bases. Small pleural effusions. No evidence of pneumothorax. No generalized Kerley lines. Cardiomegaly that is partially obscured today. IMPRESSION: Improving right upper lobe aeration but new pleural effusions and atelectasis/pneumonia at the bases. Electronically Signed   By: Monte Fantasia M.D.   On: 12/30/2017 08:43        Scheduled Meds: . albuterol  2.5 mg Nebulization TID  . aspirin EC  81 mg Oral Daily  . brimonidine  1 drop Both Eyes Q12H   And  . timolol  1 drop Both Eyes Q12H  . busPIRone  5 mg Oral TID  . chlorhexidine  15 mL Mouth Rinse BID  . dextromethorphan-guaiFENesin  1 tablet Oral BID  . enoxaparin (LOVENOX) injection  30 mg Subcutaneous Q24H  . latanoprost  1 drop Both Eyes QHS  . levothyroxine  50 mcg Oral QAC breakfast  . mouth rinse  15 mL Mouth Rinse q12n4p  . methylPREDNISolone (SOLU-MEDROL) injection  60 mg Intravenous Q24H  . metoprolol tartrate  25 mg Oral BID  . mirtazapine  7.5 mg Oral QHS  . multivitamin with minerals  1 tablet Oral Daily  . oseltamivir  30 mg Oral Daily  . pravastatin  40 mg Oral q1800  . sertraline  50 mg Oral BH-q7a  . traZODone  25 mg Oral QHS   Continuous Infusions: . ceFEPime (MAXIPIME) IV Stopped (12/30/17 1338)  . metronidazole Stopped (12/31/17 0200)  . vancomycin Stopped (12/30/17 1605)     LOS: 3 days    Time spent: 40 minutes    Elza Varricchio, Geraldo Docker, MD [facility] Pager [phone]   If 7PM-7AM, please contact night-coverage [email or web address] Password [facility] 12/31/2017, 7:39 AM

## 2018-01-01 LAB — GLUCOSE, CAPILLARY
Glucose-Capillary: 159 mg/dL — ABNORMAL HIGH (ref 65–99)
Glucose-Capillary: 125 mg/dL — ABNORMAL HIGH (ref 65–99)

## 2018-01-01 MED ORDER — LORAZEPAM 2 MG/ML IJ SOLN
1.0000 mg | Freq: Four times a day (QID) | INTRAMUSCULAR | Status: DC
  Administered 2018-01-01 (×2): 1 mg via INTRAVENOUS
  Filled 2018-01-01 (×2): qty 1

## 2018-01-01 MED ORDER — LORAZEPAM 2 MG/ML IJ SOLN: 1.0000 mg | Freq: Four times a day (QID) | INTRAMUSCULAR | 0 refills | Status: AC

## 2018-01-01 MED ORDER — KETOROLAC TROMETHAMINE 15 MG/ML IJ SOLN: 15.0000 mg | Freq: Three times a day (TID) | INTRAMUSCULAR | Status: DC | PRN

## 2018-01-01 MED ORDER — HYDROMORPHONE HCL 1 MG/ML IJ SOLN: 1.0000 mg | INTRAMUSCULAR | Status: DC | PRN

## 2018-01-01 MED ORDER — ALBUTEROL SULFATE (2.5 MG/3ML) 0.083% IN NEBU: 2.5000 mg | INHALATION_SOLUTION | RESPIRATORY_TRACT | 12 refills | Status: AC | PRN

## 2018-01-01 MED ORDER — HYDROMORPHONE HCL 1 MG/ML IJ SOLN: 1.0000 mg | INTRAMUSCULAR | 0 refills | Status: AC | PRN

## 2018-01-01 MED ORDER — ONDANSETRON HCL 4 MG/2ML IJ SOLN: 4.0000 mg | Freq: Four times a day (QID) | INTRAMUSCULAR | 0 refills | Status: AC | PRN

## 2018-01-01 MED ORDER — SODIUM CHLORIDE 0.9 % IV SOLN
1.0000 g | INTRAVENOUS | Status: DC
  Filled 2018-01-01: qty 1

## 2018-01-01 MED ORDER — HYDROMORPHONE HCL 1 MG/ML IJ SOLN: 1.0000 mg | INTRAMUSCULAR | 0 refills | Status: AC

## 2018-01-01 MED ORDER — LORAZEPAM 2 MG/ML IJ SOLN: 1.0000 mg | INTRAMUSCULAR | Status: DC | PRN

## 2018-01-01 MED ORDER — LORAZEPAM 2 MG/ML IJ SOLN: 1.0000 mg | INTRAMUSCULAR | 0 refills | Status: AC | PRN

## 2018-01-01 MED ORDER — HYDROMORPHONE HCL 1 MG/ML IJ SOLN
0.5000 mg | INTRAMUSCULAR | Status: AC
  Administered 2018-01-01: 0.5 mg via INTRAVENOUS
  Filled 2018-01-01: qty 0.5

## 2018-01-01 MED ORDER — HYDROMORPHONE HCL 1 MG/ML IJ SOLN
1.0000 mg | INTRAMUSCULAR | Status: DC
  Administered 2018-01-01: 1 mg via INTRAVENOUS
  Filled 2018-01-01: qty 1

## 2018-01-02 LAB — CULTURE, BLOOD (ROUTINE X 2)
CULTURE: NO GROWTH
CULTURE: NO GROWTH
Special Requests: ADEQUATE
Special Requests: ADEQUATE

## 2018-01-19 NOTE — Progress Notes (Signed)
While preparing the patient for transport to [facility] this nurse witnessed the patient expire. Second nurse auscultated and verified time of death of 1758. Patients daughter was notified as well as attending physician.

## 2018-01-19 NOTE — Progress Notes (Signed)
Phlebotomist unable to get blood draw this AM; pt BP 82/59(67).  RN instructed OK not to stick further (after multiple attempts) considering goals of care for comfort care vs. Hospice - conversation to be held later this AM w/Palliative care team.

## 2018-01-19 NOTE — Progress Notes (Signed)
Palliative Medicine RN Note: Met with daughter Wanda Myers. We discussed care preferences of patient prior to illness and family goals. 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   [name] states that [employer]" always said that she would not want her life artificially prolonged in any way. [name] would like to focus completely on comfort and stop any therapy not focused on comfort. She specifically requested stopping IVF (will leave at [facility] rate), stopping CBG & insulin coverage, and removing monitors. Once patient is comfortable, she would also like to stop the Olla, as [name] hates it. [name] has also used Xanax for over 20 years.   Spoke with Dr Hilma Favors. She changed morphine to dilaudid d/t renal function. Scheduled aggressive lorazepam d/t long hx of benzo use. Stopped all treatments not aimed specifically at comfort. They would like to go to inpatient hospice as soon as possible. Offered choice; while family is in [place], patient is from [place], so [facility] is first choice. I called their liason Cheri, who will reach out to the family.   I paged Dr Thereasa Solo with update.   [name], RN, BSN, [facility] Palliative Medicine Team January 15, 2018 11:01 AM Office [phone]

## 2018-01-19 NOTE — Progress Notes (Signed)
Pt transported to morgue via [facility], [facility] home information received from family and given to patient placement.

## 2018-01-19 NOTE — Discharge Summary (Signed)
DISCHARGE SUMMARY  Annali Lybrand  MR#: [id number]  DOB:07-20-1923  Date of Admission: 2018/01/25 Date of Discharge: 01/18/2018  Attending Physician:Vedha Tercero Silvestre Gunner, MD   Patient's WUJ:[id number], Lonna Cobb, PA-C  Consults: Palliative Care  Disposition: D/C to residential hospice facility   Discharge Diagnoses: Influenza A RUL Aspiration pneumonia NSTEMI / Elevated troponin Acute Diastolic CHF Chronic A. fib with acute RVR Acute kidney injury on CKD stage III Hypothyroidism HLD Dementia Anxiety Acute Altered mental status - toxic metabolic encephalopathy  Hyperglycemia  Initial presentation: 84yo F w/ a Hx of Chronic Atrial Fibrillation not on anticoagulation, HTN, Dementia, and Hypothyroidism who presented from her SNF with sepsis, and was ultimately found to have Influenza A as well as probable aspiration PNA.   In the ED she was noted to be febrile to 102.2, tachycardic to the 140s, respirations were in the 30s-40s, and her blood pressure was 81/66. Patient was given sepsis bolus fluids, and started on IV cefepime and vancomycin. Patient was placed on BiPAP.   Hospital Course:  The patient was admitted to the acute units.  She was treated via the sepsis protocol and was also provided with antiviral and antibiotic medications.  She received aggressive supportive care in the stepdown unit setting.  Unfortunately she did not mount a significant improvement.  Discussion was had with the patient's family and her power of attorney made it very clear that the patient would not wish to have her life prolonged with ongoing aggressive medical care.  Palliative Care and hospice were consulted.  The decision was made by the family to transition to comfort care.  This decision was fully supported by the medical team.  Arrangements were made and the patient was transferred to a residential hospice facility.  Below is a list of the active medical issues that were being addressed when the  decision was made to transition to full comfort care:  Influenza A - RUL Aspiration pneumonia NSTEMI / Elevated troponin Acute Diastolic CHF Chronic A. fib with acute RVR Acute on CKD stage III Hypothyroidism HLD Dementia Anxiety Acute Altered mental status Hyperglycemia  Allergies as of 01/06/2018      Reactions   Sulfa Antibiotics       Medication List    STOP taking these medications   acetaminophen 500 MG tablet Commonly known as:  TYLENOL   ALPRAZolam 0.25 MG tablet Commonly known as:  XANAX   AMBULATORY NON FORMULARY MEDICATION   aspirin EC 81 MG tablet   brimonidine-timolol 0.2-0.5 % ophthalmic solution Commonly known as:  COMBIGAN   busPIRone 5 MG tablet Commonly known as:  BUSPAR   eucerin cream   HYDROcodone-acetaminophen 5-325 MG tablet Commonly known as:  NORCO/VICODIN   latanoprost 0.005 % ophthalmic solution Commonly known as:  XALATAN   levothyroxine 50 MCG tablet Commonly known as:  SYNTHROID, LEVOTHROID   metoprolol tartrate 25 MG tablet Commonly known as:  LOPRESSOR   mirtazapine 7.5 MG tablet Commonly known as:  REMERON   multivitamin with minerals Tabs tablet   ondansetron 4 MG tablet Commonly known as:  ZOFRAN Replaced by:  ondansetron 4 MG/2ML Soln injection   polyethylene glycol packet Commonly known as:  MIRALAX / GLYCOLAX   pravastatin 40 MG tablet Commonly known as:  PRAVACHOL   sertraline 50 MG tablet Commonly known as:  ZOLOFT     TAKE these medications   albuterol (2.5 MG/3ML) 0.083% nebulizer solution Commonly known as:  PROVENTIL Take 3 mLs (2.5 mg total) by nebulization every 2 (two)  hours as needed for wheezing or shortness of breath.   HYDROmorphone 1 MG/ML injection Commonly known as:  DILAUDID Inject 1 mL (1 mg total) into the vein every 4 (four) hours.   HYDROmorphone 1 MG/ML injection Commonly known as:  DILAUDID Inject 1 mL (1 mg total) into the vein every 2 (two) hours as needed for moderate pain  or severe pain (pain or dyspnea refractory to scheduled doses).   LORazepam 2 MG/ML injection Commonly known as:  ATIVAN Inject 0.5 mLs (1 mg total) into the vein every 6 (six) hours.   LORazepam 2 MG/ML injection Commonly known as:  ATIVAN Inject 0.5 mLs (1 mg total) into the vein every 4 (four) hours as needed for anxiety (refractory to scheduled doses).   ondansetron 4 MG/2ML Soln injection Commonly known as:  ZOFRAN Inject 2 mLs (4 mg total) into the vein every 6 (six) hours as needed for nausea. Replaces:  ondansetron 4 MG tablet       Day of Discharge BP 120/88 (BP Location: Left Arm)   Pulse 69   Temp 97.6 F (36.4 C) (Axillary)   Resp 17   Ht 5\' 4"  (1.626 m)   Wt 67 kg (147 lb 11.3 oz)   SpO2 96%   BMI 25.35 kg/m   Physical Exam: Resting comfortably in bed - no acute resp distress - no evidence of uncontrolled pain or anxiety   Basic Metabolic Panel: Recent Labs  Lab 02-Aug-2018 1137 02-Aug-2018 1214 12/29/17 0236 12/30/17 0209 12/31/17 0243  NA 139 140 140 139 140  K 3.6 3.5 3.7 4.0 4.1  CL 103 104 110 108 110  CO2 14*  --  19* 18* 17*  GLUCOSE 245* 226* 150* 184* 253*  BUN 31* 32* 40* 53* 69*  CREATININE 1.95* 1.60* 1.93* 1.91* 2.05*  CALCIUM 9.2  --  7.9* 8.1* 8.2*  MG  --   --   --  1.9 2.2    Liver Function Tests: Recent Labs  Lab 02-Aug-2018 1137  AST 104*  ALT 49  ALKPHOS 59  BILITOT 1.2  PROT 6.6  ALBUMIN 3.4*   Recent Labs  Lab 02-Aug-2018 1137  LIPASE 24   CBC: Recent Labs  Lab 02-Aug-2018 1137 02-Aug-2018 1214 12/29/17 0236 12/30/17 0209 12/31/17 0243  WBC 12.2*  --  8.9 8.8 9.6  NEUTROABS 10.7*  --   --   --   --   HGB 12.3 12.9 10.8* 11.2* 12.0  HCT 40.0 38.0 34.1* 36.0 38.4  MCV 94.6  --  92.4 93.0 92.8  PLT 178  --  119* 132* 189    Cardiac Enzymes: Recent Labs  Lab 12/29/17 1203 12/29/17 1956 12/30/17 0209  TROPONINI 1.98* 1.49* 1.04*   BNP (last 3 results) Recent Labs    02-Aug-2018 1137 02-Aug-2018 2303  BNP 1,877.3*  1,605.1*    CBG: Recent Labs  Lab 12/31/17 1804 12/31/17 2058 12/31/17 2307 01/05/2018 0329 01/02/2018 0836  GLUCAP 264* 212* 190* 159* 125*   Time spent in discharge (includes decision making & examination of pt): 30 minutes  12/31/2017, 11:35 AM   Lonia BloodJeffrey T. Maytal Mijangos, MD [facility] Office  [phone] Pager [phone]  On-Call/Text Page:      [email or web address]      password [email or web address]

## 2018-01-19 NOTE — Consult Note (Signed)
[facility]: Met with pt and daughter at bedside.Marland Kitchen Spoke to her about Hopsice care, services and discussed her goals of care. She is in agreement for comfort care for her mom at [facility] Spoke to our Market researcher and she agrees pt is appropriate. Bed offered daughter signed papers to move pt to the hospice home in [place]. Webb Silversmith RN   [phone]

## 2018-01-19 NOTE — Discharge Instructions (Signed)
Hospice Introduction Hospice is a service that is designed to provide people who are terminally ill and their families with medical, spiritual, and psychological support. Its aim is to improve your quality of life by keeping you as alert and comfortable as possible. Who will be my providers when I begin hospice care? Hospice teams often include:  A nurse.  A doctor. The hospice doctor will be available for your care, but you can bring your regular doctor or nurse practitioner.  Social workers.  Religious leaders (such as a Clinical biochemist).  Trained volunteers.  What roles will providers play in my care? Hospice is performed by a team of health care professionals and volunteers who:  Help keep you comfortable: ? Hospice can be provided in your home or in a homelike setting. ? The hospice staff works with your family and friends to help meet your needs. ? You will enjoy the support of loved ones by receiving much of your basic care from family and friends.  Provide pain relief and manage your symptoms. The staff supply all necessary medicines and equipment.  Provide companionship when you are alone.  Allow you and your family to rest. They may do light housekeeping, prepare meals, and run errands.  Provide counseling. They will make sure your emotional, spiritual, and social needs and those of your family are being met.  Provide spiritual care: ? Spiritual care will be individualized to meet your needs and your family's needs. ? Spiritual care may involve:  Helping you look at what death means to you.  Helping you say goodbye to your family and friends.  Performing a specific religious ceremony or ritual.  When should hospice care begin? Most people who use hospice are believed to have fewer than 6 months to live.  Your family and health care providers can help you decide when hospice services should begin.  If your condition improves, you may discontinue the program.  What  should I consider before selecting a program? Most hospice programs are run by nonprofit, independent organizations. Some are affiliated with hospitals, nursing homes, or home health care agencies. Hospice programs can take place in the home or at a hospice center, hospital, or skilled nursing facility. When choosing a hospice program, ask the following questions:  What services are available to me?  What services will be offered to my loved ones?  How involved will my loved ones be?  How involved will my health care provider be?  Who makes up the hospice care team? How are they trained or screened?  How will my pain and symptoms be managed?  If my circumstances change, can the services be provided in a different setting, such as my home or in the hospital?  Is the program reviewed and licensed by the state or certified in some other way?  Where can I learn more about hospice? You can learn about existing hospice programs in your area from your health care providers. You can also read more about hospice online. The websites of the following organizations contain helpful information:  [facility] and Palliative Care Organization [facility]).  The Hospice Association of America ([place]).  The [place].  The American Cancer Society (ACS).  Hospice Net.  This information is not intended to replace advice given to you by your health care provider. Make sure you discuss any questions you have with your health care provider. Document Released: 02/24/2004 Document Revised: 06/23/2016 Document Reviewed: 09/17/2013 Elsevier Interactive Patient Education  2017 [employer].

## 2018-01-19 NOTE — Progress Notes (Signed)
Do we have hand blank cards from [place] that I can send to patient's family to send my condolences?

## 2018-01-19 NOTE — Progress Notes (Signed)
Clinical Social Worker facilitated patient discharge and Hospice personal spoke with family and facility to confirm patient discharge plans.  Clinical information faxed to facility and family agreeable with plan.  CSW arranged ambulance [facility] via [facility] [place] .  RN to call [phone] for report prior to discharge.  Clinical Social Worker will sign off for now as social work intervention is no longer needed. Please consult us again if new need arises.  Marrianne MoodAshley Shyvonne Chastang, MSW, [employer]CSWA [phone]

## 2018-01-19 DEATH — deceased

## 2018-07-27 IMAGING — DX DG CHEST 1V PORT
1 series · 1 of 1 positions shown · non-contrast
Comparison: 06/29/2016

CLINICAL DATA: Encounter for respiratory distress and hypoxia.

EXAM:
PORTABLE CHEST 1 VIEW

[chest ap]
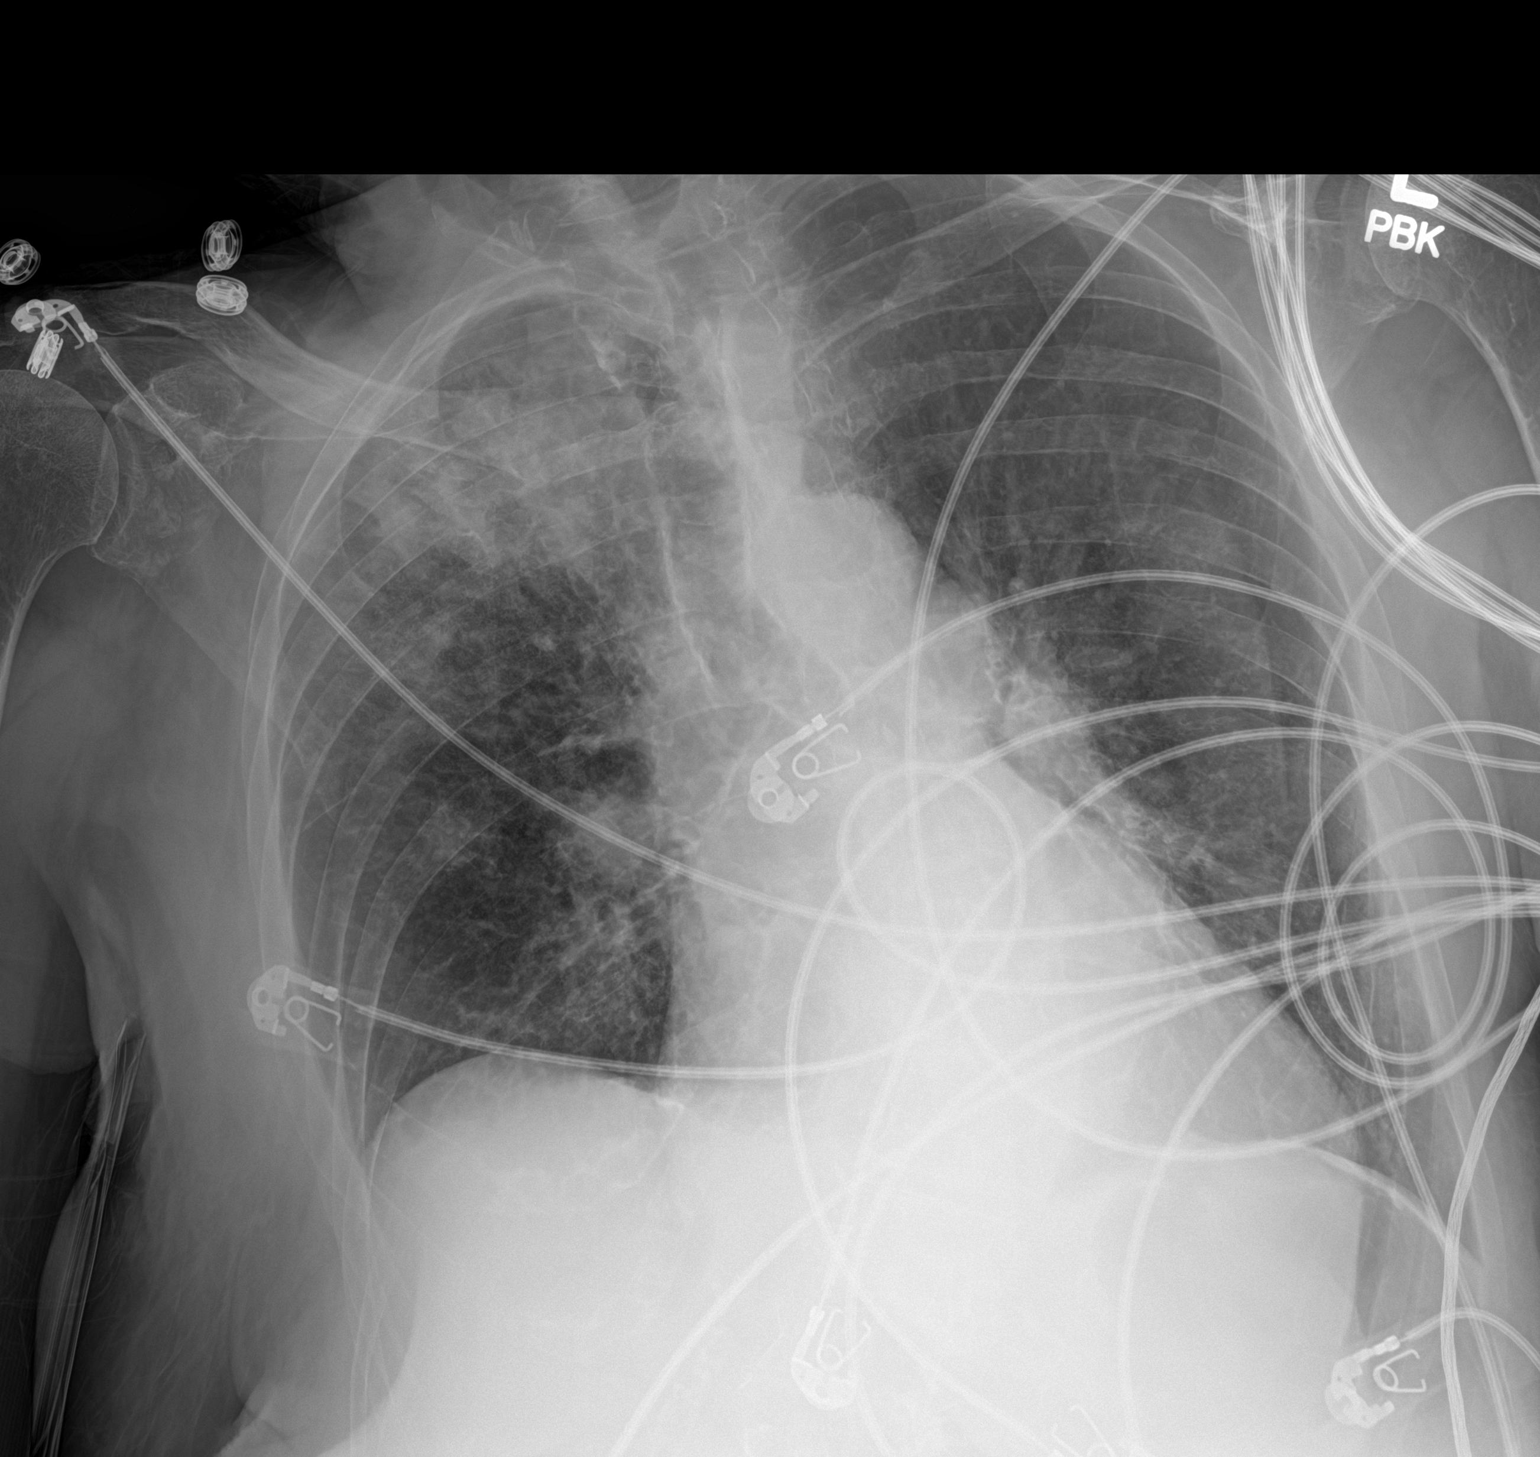

[1 of 1 positions shown; findings below may reference images not displayed]

FINDINGS: Airspace disease on the right at the apex primarily. Chronic
cardiomegaly. Mediastinal contours are distorted by rotation. No
visible cavitation or effusion. Artifact from EKG leads.
IMPRESSION: 1. Right upper lobe pneumonia. Followup PA and lateral chest X-ray
is recommended in 3-4 weeks following trial of antibiotic therapy to
ensure resolution.
2. Chronic cardiomegaly.
